# Patient Record
Sex: Female | Born: 1975 | Race: White | Marital: Married | Smoking: Never smoker
Health system: Northeastern US, Academic
[De-identification: ages and names within clinical notes are randomized; demographics above are authoritative.]

## PROBLEM LIST (undated history)

## (undated) DIAGNOSIS — IMO0002 Reserved for concepts with insufficient information to code with codable children: Secondary | ICD-10-CM

## (undated) DIAGNOSIS — D6851 Activated protein C resistance: Secondary | ICD-10-CM

## (undated) HISTORY — DX: Reserved for concepts with insufficient information to code with codable children: IMO0002

---

## 2009-08-12 ENCOUNTER — Ambulatory Visit: Payer: Self-pay

## 2009-08-12 ENCOUNTER — Other Ambulatory Visit: Payer: Self-pay | Admitting: Maternal & Fetal Medicine

## 2009-09-04 ENCOUNTER — Other Ambulatory Visit: Payer: Self-pay | Admitting: Gastroenterology

## 2009-09-04 ENCOUNTER — Ambulatory Visit: Payer: Self-pay

## 2009-09-04 ENCOUNTER — Ambulatory Visit
Admit: 2009-09-04 | Discharge: 2009-09-04 | Disposition: A | Discharge status: Home / Self Care | Payer: Self-pay | Source: Ambulatory Visit | Attending: Obstetrics and Gynecology | Admitting: Obstetrics and Gynecology

## 2009-09-09 LAB — MATERNAL 1ST TRIMESTER SCR (11-13 6/7 WEEKS)
Age at Delivery: 33 yrs.
CRL: 68.6 mm
DS A Priori Risk: 1:343 {titer}
DS Screen Risk: 1:989 {titer}
HCG MoM: 0.64
NT MoM: 1.27
NT: 2.1 mm
PAPP-A MoM: 0.28
PAPP-A: 0.79 m[IU]/mL
Patient Weight: 155 [lb_av]
T18 A Priori Risk: 1:855 {titer}
T18 Screen Risk: 1:207 {titer}
hCG: 45494 m[IU]/mL

## 2009-09-16 ENCOUNTER — Ambulatory Visit
Admit: 2009-09-16 | Discharge: 2009-09-16 | Disposition: A | Discharge status: Home / Self Care | Payer: Self-pay | Source: Ambulatory Visit | Attending: Obstetrics and Gynecology | Admitting: Obstetrics and Gynecology

## 2009-09-16 LAB — TYPE AND SCREEN FOR PNP
ABO RH Blood Type: O POS
Antibody Screen: NEGATIVE

## 2009-09-17 LAB — N. GONORRHOEAE DNA AMPLIFICATION: N. gonorrhoeae DNA Amplification: 0

## 2009-09-17 LAB — CHLAMYDIA PLASMID DNA AMPLIFICATION: Chlamydia Plasmid DNA Amplification: 0

## 2009-09-17 LAB — HIV-1 AND 2 AB: HIV 1&2 Ab screen: NEGATIVE

## 2009-09-17 LAB — AEROBIC CULTURE: Aerobic Culture: 0

## 2009-09-17 LAB — LEAD VENOUS: Lead,Venous: <1 ug/dL (ref 0–25)

## 2009-09-17 LAB — GYN CYTOLOGY

## 2009-09-17 LAB — HEPATITIS B SURFACE ANTIGEN: HBV S Ag: NEGATIVE

## 2009-09-18 LAB — OBSTETRICS PANEL
Baso # K/uL: 0 THOU/uL (ref 0.0–0.1)
Basophil %: 0.1 % (ref 0.1–1.2)
Eos # K/uL: 0.1 THOU/uL (ref 0.0–0.4)
Eosinophil %: 0.8 % (ref 0.7–5.8)
Hematocrit: 37 % (ref 34–45)
Hemoglobin: 12.4 g/dL (ref 11.2–15.7)
Lymph # K/uL: 2.1 THOU/uL (ref 1.2–3.7)
Lymphocyte %: 29.4 % (ref 19.3–51.7)
MCV: 91 fL (ref 79–95)
Mono # K/uL: 0.5 THOU/uL (ref 0.2–0.9)
Monocyte %: 6.3 % (ref 4.7–12.5)
Neut # K/uL: 4.6 THOU/uL (ref 1.6–6.1)
Platelets: 280 THOU/uL (ref 160–370)
RBC: 4.1 MIL/uL (ref 3.9–5.2)
RDW: 13.4 % (ref 11.7–14.4)
RPR Screen: NONREACTIVE
Rubella IgG AB: IMMUNE
Seg Neut %: 63.4 % (ref 34.0–71.1)
WBC: 7.2 THOU/uL (ref 4.0–10.0)

## 2009-09-30 LAB — CYSTIC FIBROSIS DNA (BAYLOR)

## 2009-10-20 ENCOUNTER — Other Ambulatory Visit: Payer: Self-pay | Admitting: Obstetrics and Gynecology

## 2009-10-20 ENCOUNTER — Ambulatory Visit
Admit: 2009-10-20 | Discharge: 2009-10-20 | Disposition: A | Discharge status: Home / Self Care | Payer: Self-pay | Source: Ambulatory Visit | Attending: Obstetrics and Gynecology | Admitting: Obstetrics and Gynecology

## 2009-10-20 ENCOUNTER — Ambulatory Visit: Payer: Self-pay

## 2009-10-21 LAB — MATERNAL AFP ONLY (14-22 67 WEEKS)
AFP MoM: 1.37
AFP: 59 IU/mL
Age at Delivery: 33 yrs.
OSB Risk: 1:6920 {titer}
Patient Weight: 167 [lb_av]

## 2009-11-06 ENCOUNTER — Ambulatory Visit: Payer: Self-pay

## 2009-11-06 ENCOUNTER — Other Ambulatory Visit: Payer: Self-pay | Admitting: Obstetrics and Gynecology

## 2009-12-14 ENCOUNTER — Ambulatory Visit
Admit: 2009-12-14 | Discharge: 2009-12-14 | Disposition: A | Discharge status: Home / Self Care | Payer: Self-pay | Source: Ambulatory Visit | Attending: Obstetrics and Gynecology | Admitting: Obstetrics and Gynecology

## 2009-12-14 LAB — GLUCOSE TOLERANCE, 1 HOUR: Glucose,50gm 1HR: 71 mg/dL (ref 63–135)

## 2009-12-14 LAB — HEMATOCRIT: Hematocrit: 37 % (ref 34–45)

## 2009-12-21 ENCOUNTER — Ambulatory Visit
Admit: 2009-12-21 | Discharge: 2009-12-21 | Disposition: A | Discharge status: Home / Self Care | Payer: Self-pay | Source: Ambulatory Visit | Attending: Obstetrics and Gynecology | Admitting: Obstetrics and Gynecology

## 2009-12-22 LAB — VAGINITIS SCREEN: DNA PROBE: Vaginitis Screen:DNA Probe: POSITIVE

## 2009-12-26 ENCOUNTER — Encounter: Payer: Self-pay | Admitting: Obstetrics and Gynecology

## 2009-12-26 ENCOUNTER — Ambulatory Visit: Admit: 2009-12-26 | Payer: Self-pay | Source: Ambulatory Visit | Admitting: Obstetrics and Gynecology

## 2010-02-17 ENCOUNTER — Ambulatory Visit
Admit: 2010-02-17 | Discharge: 2010-02-17 | Disposition: A | Discharge status: Home / Self Care | Payer: Self-pay | Source: Ambulatory Visit | Attending: Pediatrics | Admitting: Pediatrics

## 2010-02-19 LAB — GROUP B STREP CULTURE

## 2010-02-21 NOTE — L&D Delivery Note (Signed)
Delivery Summary Note  Patient: Melanie Curtis  Age: 35 y.o.  Date of Birth: 1975-08-30  ZOX:WRUEAV.  MRN: [id number]    Admission Summary  Date and time of admission: 03/06/2010  5:50 PM Attending Provider: Loleta Dicker, MD Group:  Active Hospital Problems   Diagnoses   . Marland Kitchen*Active labor   . Factor 5 Leiden mutation, heterozygous     Sulfa drugs  Pre-pregnancy weight:  Current Weight:   Weight gain:     Obstetric History    G4   P3   T3   P0   A0   TAB0   SAB0   E0   M0   L3       Feeding Type:     Circumcision:     Pediatrician:     Prenatal Labs  Rubella IgG AB   Date Value Range Status   09/16/2009 IMMUNE  IMMUNE (no units) Final      TEST METHOD: EIA      Gestational Age Information  LMP: Patient's last menstrual period was 06/10/2009.   EDC: 03/17/2010, by Last Menstrual Period  Gestational Age at Delivery:   Information for the patient's newborn:  Spenser, Boy [[id number]]   Gestational Age: <None>       Labor Summary  Labor Events:    Preterm labor: No   Rupture date:    Rupture time:    Rupture type: Spontaneous   Fluid Color: Clear   Induction: None   Augmentation: None   Complications:    Cervical ripening:         Stage 1:  hr minutes   Stage 2:  hr minutes   Stage 3:  hr minutes       Delivery:    Episiotomy: None   Lacerations: 1st   Repair Done: No    Sponge Count: Correct   Needle Count: Correct  Action-Taken:    Blood loss (ml): 250     Information for the patient's newborn:  Dejonae, Stuedemann [[id number]]     Delivery  Patient: Boy Ahlers  YQM:VHQI GA: Gestational Age: <None> MRN: [id number]  03/07/2010 5:11 AM by  Vaginal, Spontaneous Delivery    Delivery Clinician:  Loleta Dicker  Living?: Yes  Anesthesia: Epidural         APGARS  One minute Five minutes Ten minutes   Skin color:         Heart rate:         Grimace:         Muscle tone:         Breathing:         Totals:         Presentation/position: Vertex  Right Occiput Anterior  Resuscitation: None   Cord information: 3 Vessels   Disposition of cord blood:  Lab    Blood gases sent? No  Complications: None   Placenta: Delivered:    Spontaneous  Intact appearance  Newborn Measurements:  Weight:   Height:   Head circumference:   Chest circumference:    Other providers: Delivery Assist  Delivery Nurse Edd Arbour  Yetta Flock   Additional  information:  Forceps:    Vacuum:    Breech:    Observed anomalies            35yo caucasian female G4P3003 admitted to L&D with spontaneous labor after SROM at home.   Epidural for pain control. Progressed to full dilation and pushed for approximately 10 minutes to delivery  a female infant over intact perineum.   NSVD. No nuchal. Clear fluid. ROA presentation. Uncomplicated placenta delivery - intact, 3VC. Stable lochia.     Edd Arbour, MD   OBGYN Resident   Pager# [phone]

## 2010-03-06 ENCOUNTER — Inpatient Hospital Stay
Admit: 2010-03-06 | Disposition: A | Discharge status: Home / Self Care | Payer: Self-pay | Source: Ambulatory Visit | Attending: Pediatrics | Admitting: Pediatrics

## 2010-03-06 ENCOUNTER — Encounter: Payer: Self-pay | Admitting: Pediatrics

## 2010-03-06 DIAGNOSIS — IMO0001 Reserved for inherently not codable concepts without codable children: Secondary | ICD-10-CM | POA: Diagnosis present

## 2010-03-06 DIAGNOSIS — D6851 Activated protein C resistance: Secondary | ICD-10-CM | POA: Diagnosis present

## 2010-03-06 HISTORY — DX: Activated protein C resistance: D68.51

## 2010-03-06 LAB — CBC
Hematocrit: 37 % (ref 34–45)
Hemoglobin: 12.6 g/dL (ref 11.2–15.7)
MCV: 92 fL (ref 79–95)
Platelets: 224 10*3/uL (ref 160–370)
RBC: 4 MIL/uL (ref 3.9–5.2)
RDW: 13.4 % (ref 11.7–14.4)
WBC: 12.6 10*3/uL — ABNORMAL HIGH (ref 4.0–10.0)

## 2010-03-06 LAB — TYPE AND SCREEN
ABO RH Blood Type: O POS
Antibody Screen: NEGATIVE

## 2010-03-06 MED ORDER — ONDANSETRON HCL 2 MG/ML IV SOLN *I*
4.0000 mg | Freq: Four times a day (QID) | INTRAMUSCULAR | Status: DC | PRN
Start: 2010-03-06 — End: 2010-03-08
  Administered 2010-03-07: 4 mg via INTRAVENOUS
  Filled 2010-03-06: qty 2

## 2010-03-06 MED ORDER — ONDANSETRON HCL 2 MG/ML IV SOLN *I*
INTRAMUSCULAR | Status: AC
Start: 2010-03-06 — End: 2010-03-06
  Administered 2010-03-06: 4 mg via INTRAVENOUS
  Filled 2010-03-06: qty 2

## 2010-03-06 MED ORDER — LACTATED RINGERS IV SOLN *I*
150.0000 mL/h | INTRAVENOUS | Status: DC
Start: 2010-03-06 — End: 2010-03-07
  Administered 2010-03-06 – 2010-03-07 (×5): 150 mL/h via INTRAVENOUS

## 2010-03-06 MED ORDER — PENICILLIN G POTASSIUM 5 MU IN NS 50 ML IVPB *I*
INTRAVENOUS | Status: AC
Start: 2010-03-06 — End: 2010-03-06
  Administered 2010-03-06: 5 10*6.[IU] via INTRAVENOUS
  Filled 2010-03-06: qty 50

## 2010-03-06 MED ORDER — PENICILLIN G POTASSIUM 5 MU IN NS 50 ML IVPB *I*
5.0000 10*6.[IU] | Freq: Once | INTRAVENOUS | Status: AC
Start: 2010-03-06 — End: 2010-03-06

## 2010-03-06 MED ORDER — PENICILLIN G POT IN DEXTROSE 60000 UNIT/ML IV SOLN *I*
3.0000 10*6.[IU] | INTRAVENOUS | Status: DC
Start: 2010-03-06 — End: 2010-03-07
  Administered 2010-03-06 – 2010-03-07 (×2): 3 10*6.[IU] via INTRAVENOUS
  Filled 2010-03-06 (×2): qty 50

## 2010-03-06 MED ORDER — PROMETHAZINE HCL 25 MG/ML IJ SOLN *I*
12.5000 mg | Freq: Once | INTRAMUSCULAR | Status: AC
Start: 2010-03-06 — End: 2010-03-06

## 2010-03-06 MED ORDER — PROMETHAZINE HCL 25 MG/ML IJ SOLN *I*
INTRAMUSCULAR | Status: AC
Start: 2010-03-06 — End: 2010-03-06
  Administered 2010-03-06: 12.5 mg via INTRAVENOUS
  Filled 2010-03-06: qty 1

## 2010-03-06 NOTE — Progress Notes (Signed)
Pt moved to labor room at 1816. Pt. And husband refusing to wear toco for 20 minute strip. Pt and husband willing to wear ultrasound for 20 minute strip if husband holds monitor. Ok as per [employer] Tol. 20 minute strip complete. Unable to determine variables, and length of contractions due to no toco. Pt and husband report length of contraction and strength. As per pts husband, "baby moving a lot causing heart rate to be high."  As per Dr Karma Greaser, pt to come off EFM and pt to be intermittently monitored.  Unable to complete Admit. Info, pt and husband preferring to have birth plan to answer questions or to be addressed later.

## 2010-03-06 NOTE — Interdisciplinary Rounds (Signed)
35yo [id number] at [redacted]w[redacted]d admitted in spontaneous labor after SROM at home at 4pm for blood-tinged fluid.     Rh+ / Rub Immune / GBS + -> PCN running.   3cm/50%/ posterior on admission.     Risks: GBS+, Factor 5 leiden heterozygote.     *FYI: Husband is an ED resident @ [facility].    Desires minimal intervention and intermittent monitoring.     FHR tracing segments reviewed with team.     Intercare Team: C Previte (24hr attd), E Fountaine, L Berven, SCN, Science writer (CRNA), Charge RN     Edd Arbour, MD   OBGYN Resident   Pager# [phone]

## 2010-03-06 NOTE — Progress Notes (Signed)
INTRAPARTUM NOTE:    S: Pt in the tub per nursing, no complaints.    O:  Filed Vitals:    03/06/10 1857 03/06/10 1900   BP: 102/65    Pulse: 119    Temp:  36.6 C (97.9 F)   TempSrc:  Axillary     Pelvic Exam:  Deferred.  Per nursing, now 4-5 cm, still high.    EFM: Intermittent monitoring  Baseline  125 bpm         Variability moderate       Accels: present       Decels: absent  Cat I  Toco: not clearly recording, pt now in tub.  Labor Assessment: entering active labor.    A/P:    35 y.o. G4P3003 at [redacted]w[redacted]d admitted for SROM, labor.    1) GBS +, on PCN, last dose 2210  2) Pain: Pt does not want pain meds  3) Nausea: phenergan and zofran given  4) EFM intermittent per pt request  5) Labor: Expectant management.    Duane Lope, MD  03/06/2010  11:14 PM

## 2010-03-06 NOTE — Progress Notes (Signed)
Back in bed. FHR 120 - 130 on intermittent monitoring. Contractions every 2-3 minutes. Relaxing well between contractions. Does not want pain medication.     Blood pressure 102/65, pulse 119, temperature 36.6 C (97.9 F), temperature source Axillary, last menstrual period 06/10/2009.    A/p:  Active labor - 2nd dose of PCN given. Continue intermittent monitoring.

## 2010-03-06 NOTE — H&P (Signed)
OB H&P for Inpatients  History of Present Illness:  Melanie Curtis is a 35 y.o. G4P3003 at [redacted]w[redacted]d weeks and estimated date of delivery of 03/17/2010, by Last Menstrual Period who presents with No chief complaint on file.   She has Active labor and Factor 5 Leiden mutation, heterozygous on her problem list.     OB History     Grav Para Term Preterm Abortions TAB SAB Ect Mult Living    4 3 3  0 0 0 0 0 0 3       # Outc Date GA Lbr Len/2nd Wgt Sex Del Anes PTL Lv    1 CUR             2 TRM  100w2d  6.045WU([id number]) M SVD       3 TRM  [redacted]w[redacted]d  7.829FA([id number]) F SVD       Comments: Vaginal breech    4 TRM  [redacted]w[redacted]d  4.536kg(10lb) M SVD              Melanie Curtis  has a past medical history of Factor 5 Leiden mutation, heterozygous and Cervical dysplasia. She  has past surgical history that includes Tonsillectomy and adenoidectomy. Santia is allergic to sulfa drugs. Her family history is negative except for distant relative with Factor V Leiden.  family history is not on file.She  reports that she has never smoked. She has never used smokeless tobacco. She reports that she does not drink alcohol or use illicit drugs..    Prior to Admission Medications:  Prescriptions prior to admission   Medication Sig   . acetaminophen-codeine (TYLENOL #3) 300-30 MG per tablet Take 1-2 tablets by mouth every 12 hours as needed.         Review of Systems:  Review of Systems   Constitutional: Negative.    HENT: Negative.    Eyes: Negative.    Respiratory: Negative.    Cardiovascular: Negative.    Gastrointestinal: Positive for nausea. Negative for heartburn, vomiting, abdominal pain, diarrhea, constipation, blood in stool and melena.   Genitourinary: Negative.    Musculoskeletal: Negative.    Skin: Negative.    Neurological: Negative.    Psychiatric/Behavioral: Negative.        Last Nursing documented pain:      Vitals: BP 102/65  Pulse 119  Temp(Src) 36.6 C (97.9 F) (Axillary)  LMP 06/10/2009     Physical Examination:    HEENT:  Normocephalic; atraumatic  Cardiovascular: Regular rate and rhythm with no murmurs  Respiratory: Clear to auscultate  Abdomen: Gravid non-tender    Fundal Height:41 cm Estimated Fetal Weight: 8 1/2 # Estimated by: Leopold's     Pelvis Adequacy: Adequate    Neurological: Normal, average response (2+)  Extremities/Skin: Minimal edema of pedal and pretibial (1+)    Lab Results:   ABO RH Blood Type   Date Value Range Status   03/06/2010 O RH POS   Final        Antibody Screen   Date Value Range Status   03/06/2010 Negative   Final        Rubella IgG AB   Date Value Range Status   09/16/2009 IMMUNE  IMMUNE (no units) Final      TEST METHOD: EIA        RPR Screen   Date Value Range Status   09/16/2009 NONREACT  NONREACT (no units) Final      TEST METHOD: Charcoal Particle Agglutination  HBV S Ag   Date Value Range Status   09/16/2009 NEG   Final        Group B Strep Culture   Date Value Range Status   02/17/2010 Streptococcus agalactiae (Group B)   Final        HIV 1 and 2 Ab   Date Value Range Status   09/16/2009 NEG   Final      TEST METHOD: EIA        Glucose,50gm 1HR   Date Value Range Status   12/14/2009 71  63-135 (mg/dL) Final        Chlamydia Plasmid DNA Amplification   Date Value Range Status   09/16/2009 .   Final        Hematocrit   Date Value Range Status   03/06/2010 37  34-45 (%) Final        Platelets   Date Value Range Status   03/06/2010 224  160-370 (THOU/uL) Final         Last Pap: normal        Ultrasound    Date: 03/06/10 in triage Gestational Age: no measurements done Estimated Fetal weight: 8 1/2 #, Fetal Presentation: vertex  Impression: vertex    Assessment & Plan  G4P3003, term pregnancy with SROM, GBS+ - admit, start PCN. Pt wishes no pain medication. Discussed patient's wish for intermittent monitoring, willing to have continuous monitoring if needed.      Author: Loleta Dicker, MD  Note created: 03/06/2010  at: 8:40 PM

## 2010-03-06 NOTE — Progress Notes (Signed)
34 y.o. G4 P4004 admitted after SROM at home, approximately 4:15 PM. Had large gush of blood tinged fluid. Feeling very active fetal movement. Started having contractions 1 hour prior to admission. GBS+. Past 2 weeks has been having sciatic nerve pain and right sided rib pain, taking Tylenol #3, 2-4 tabs per day. Pregnancy otherwise uncomplicated. [place] in triage confirmed vertex presentation.     Here with husband, [name]. Pt strongly prefers intermittent monitoring, no pain medications and infrequent cervical exams.     FHR initially 160 baseline with accelerations. Then increased to baseline of 180 with accelerations. Pt is afebrile but likely some dehydration as did not drink a lot today, feeling nauseated. IVF bolus given, baseline decreased to 160.  Doing intermittent hand-held monitoring per patient's wishes.     PE:  Cervix - 3 cm, 50%, posterior, -2 station  Fluid continues to be blood tinged.     A/P:  Term, SROM, GBS+, initial fetal tachycardia improved after IVF bolus, early labor.  Continue intermittent monitoring per patient wishes, 2nd liter of IVF, Zofran 4 mg IV for nausea, PCN IV.  Pt does not want want pain medication.

## 2010-03-07 ENCOUNTER — Encounter: Payer: Self-pay | Admitting: Pediatrics

## 2010-03-07 MED ORDER — IBUPROFEN 200 MG TAB - SELF MED *I*
600.0000 mg | ORAL_TABLET | Freq: Four times a day (QID) | ORAL | Status: DC | PRN
Start: 2010-03-07 — End: 2010-03-08

## 2010-03-07 MED ORDER — DIPHENHYDRAMINE HCL 25 MG PO TABS *I*
25.0000 mg | ORAL_TABLET | Freq: Every evening | ORAL | Status: DC | PRN
Start: 2010-03-07 — End: 2010-03-08

## 2010-03-07 MED ORDER — LACTATED RINGERS IV SOLN *I*
150.0000 mL/h | INTRAVENOUS | Status: DC
Start: 2010-03-07 — End: 2010-03-08

## 2010-03-07 MED ORDER — FENTANYL 2 MCG/ML AND 0.125% BUPIVACAINE *A*
12.0000 mL/h | INTRAMUSCULAR | Status: DC
Start: 2010-03-07 — End: 2010-03-07

## 2010-03-07 MED ORDER — OXYCODONE-ACETAMINOPHEN 5-325 MG PO TABS *I*
1.0000 | ORAL_TABLET | ORAL | Status: DC | PRN
Start: 2010-03-07 — End: 2010-03-08
  Administered 2010-03-07 – 2010-03-08 (×6): 1 via ORAL
  Filled 2010-03-07 (×6): qty 1

## 2010-03-07 MED ORDER — ONDANSETRON HCL 2 MG/ML IV SOLN *I*
4.0000 mg | Freq: Four times a day (QID) | INTRAMUSCULAR | Status: DC | PRN
Start: 2010-03-07 — End: 2010-03-08

## 2010-03-07 MED ORDER — DOCUSATE SODIUM 100 MG SELF MED *A*
100.0000 mg | ORAL_CAPSULE | Freq: Two times a day (BID) | ORAL | Status: DC | PRN
Start: 2010-03-07 — End: 2010-03-08

## 2010-03-07 NOTE — Anesthesia Pre-procedure Eval (Signed)
Anesthesia Pre-operative Evaluation for Melanie Curtis  Health History  Past Medical History   Diagnosis Date   . Factor 5 Leiden mutation, heterozygous    . Cervical dysplasia      Past Surgical History   Procedure Date   . Tonsillectomy and adenoidectomy      Social History  History   Substance Use Topics   . Smoking status: Never Smoker    . Smokeless tobacco: Never Used   . Alcohol Use: No      History   Drug Use No     Allergies:   Allergies   Allergen Reactions   . Sulfa Drugs Rash     Medications   Prescriptions prior to admission   Medication Sig   . acetaminophen-codeine (TYLENOL #3) 300-30 MG per tablet Take 1-2 tablets by mouth every 12 hours as needed.        Current Facility-Administered Medications   Medication Dose Route Frequency   . penicillin G potassium IV 5 Million Units  5 Million Units Intravenous Once   . penicillin G potassium IVPB 3 Million Units  3 Million Units Intravenous Q4H   . ondansetron (ZOFRAN) injection 4 mg  4 mg Intravenous Q6H PRN   . Lactated Ringers Infusion  150 mL/hr Intravenous Continuous   . promethazine (PHENERGAN) injection 12.5 mg  12.5 mg Intravenous Once     Medications Administered by Facility in Past 24hrs  Lactated Ringers Infusion     Date Action Dose Route User    03/07/2010 0042 New Bag 150 mL/hr Intravenous Yetta Flock, RN    03/06/2010 2210 New Bag 150 mL/hr Intravenous Yetta Flock, RN    03/06/2010 1938 New Bag 150 mL/hr Intravenous Yetta Flock, RN    03/06/2010 1855 New Bag 150 mL/hr Intravenous Lavone Nian, RN      ondansetron [facility]) injection 4 mg     Date Action Dose Route User    03/06/2010 1942 Given 4 mg Intravenous Yetta Flock, RN      penicillin G potassium IVPB 3 Million Units     Date Action Dose Route User    03/06/2010 2210 Given 3 Million Units Intravenous Yetta Flock, RN      penicillin G potassium IV 5 Million Units     Date Action Dose Route User    03/06/2010 1849 Given 5 Million Units Intravenous Lavone Nian, RN      promethazine (PHENERGAN) injection 12.5 mg     Date Action Dose Route User    03/06/2010 2125 Given 12.5 mg Intravenous Yetta Flock, RN           Anesthesia Evaluation      No history of anesthetic complications   Airway   Mallampati: II  TM distance: >3 FB  Neck ROM: full  Dental      Pulmonary - negative ROS   Cardiovascular - negative ROS    Neuro/Psych - negative ROS     GI/Hepatic/Renal - negative ROS     Endo/Other - negative ROS   Abdominal                         Additional ROS/Co-morbidities: None known    Mental Status: alert, oriented to person, place, and time    Last PO Intake: Cereal this morning, sips of water all day     Most Recent Vitals: BP 102/65  Pulse 119  Temp(Src) 36.6  C (97.9 F) (Axillary)  LMP 06/10/2009  Vital Sign Ranges (last 24hrs)  Temp:  [36.6 C (97.9 F)] 36.6 C (97.9 F)  Heart Rate:  [119] 119   BP: (102)/(65) 102/65 mmHg        Most Recent Lab Results   Blood Type  Lab Results   Component Value Date    ABORH O RH POS 03/06/2010    ABS Negative 03/06/2010        CBC  Lab Results   Component Value Date    WBC 12.6* 03/06/2010    HCT 37 03/06/2010    PLT 224 03/06/2010    Chem-7  No results found for this basename: NA, K, WBK, CL, CO2, UN, CREAT, WBGLU, PGLU     Electrolytes  No results found for this basename: CA, MG, PO4    Coags  No results found for this basename: PTI, INR, PTT    LFTs  No results found for this basename: AST, ALT, ALK         Pregnancy Test (if applicable)  No results found for this basename: PUPT, UPREG, SPREG, HCG1       EKG Results    All labs in the last 72 hours   Recent Results (from the past 72 hour(s))   CBC    Collection Time    03/06/10  6:54 PM       Component Value Range    WBC 12.6 (*) 4.0 - 10.0 (THOU/uL)    RBC 4.0  3.9 - 5.2 (MIL/uL)    Hemoglobin 12.6  11.2 - 15.7 (g/dL)    Hematocrit 37  34 - 45 (%)    MCV 92  79 - 95 (fL)    RDW 13.4  11.7 - 14.4 (%)    Platelets 224  160 - 370 (THOU/uL)   TYPE AND SCREEN    Collection Time     03/06/10  6:54 PM       Component Value Range    ABO RH Blood Type O RH POS      Antibody Screen Negative           Medical Problems  Patient Active Problem List   Diagnoses Date Noted   . Active labor 03/06/2010   . Factor 5 Leiden mutation, heterozygous 03/06/2010     PreOp/PreAn Diagnosis: Pregnancy, Labor Pain    Planned Procedure: Labor Epidural    Anesthesia Plan    ASA 1     regional   (Labor Epidural, Patient Understands And Accepts Risks Of Anesthesia)  intravenous induction   Anesthetic plan and risks discussed with patient.    Plan discussed with CRNA.        Anesthesia Risks discussed: allergic reaction, unexpected serious injury, death and spinal headache, hypotension, infection, temporary or permanent lower extremity weakness or numbness     Invasive Monitoring discussed:  none    Attending Attestation: The patient or proxy understand and accept the risks and benefits of the anesthesia plan. By accepting this note, I attest that I have personally performed the history and physical exam and prescribed the anesthetic plan within 48 hours prior to the anesthetic as documented by me above.    Author: Modena Nunnery, MD Note created: 03/07/2010  at: 12:50 AM

## 2010-03-07 NOTE — Progress Notes (Signed)
Peri care and fresh pad applied in bed, pt feels urge to void, pt oob to bathroom with minimal assist, pt transferred to [id number] in wheel chair with baby by Pyatt RN, report given to [employer]

## 2010-03-07 NOTE — Anesthesia Post-procedure Eval (Signed)
Anesthesia Post-op Note    Patient: Melanie Curtis    Procedure(s) Performed: Labor Epidural    Anesthesia type: Epidural    Patient location: Labor and Delivery    Mental Status: Recovered to baseline    Patient able to participate in this evaluation: yes  Last Vitals: BP 105/51  Pulse 105  Temp(Src) 36.8 C (98.2 F) (Temporal)  Resp 17  Ht 1.765 m (5' 9.5")  Wt 96.163 kg (212 lb)  BMI 30.86 kg/m2  SpO2 95%  LMP 06/10/2009  Breastfeeding? Unknown     Post-op vital signs noted above are within patient's normal range  Post-op vitals signs: stable  Respiratory function: baseline    Airway patent: Yes    Cardiovascular and hydration status stable: Yes    Post-Op pain: Adequate analgesia    Post-Op assessment: no apparent anesthetic complications    Complications: none    Attending Attestation: All indicated post anesthesia care provided    Author: Modena Nunnery, MD  as of: 03/07/2010  at: 8:20 AM

## 2010-03-07 NOTE — Progress Notes (Signed)
Much more comfortable after second epidural, BP low but now increasing after IVF.      Cervix 8 cm prior to epidural, 0 station, 90%.  FHR - no decels, moderate variability, accels present, Cat I    A/P:  35 y.o. G4P3003 at [redacted]w[redacted]d, active labor.   -3rd dose of PCN for GBS  - second dose of Zofran for nausea  - continue current management.

## 2010-03-07 NOTE — Discharge Summary (Signed)
Discharge Summary       Admit date: 03/06/2010         Discharge date and time: 03/09/2010  Admitting Physician: Loleta Dicker, MD   Discharge Attending: Dr. Bluford Main     Patient: Melanie Curtis Age: 35 y.o. Date of Birth: May 14, 1975 ZOX:WRUEAV    Chief Complaint: Active Labor, SROM  Principal Problem: s/p Normal Spontaneous Vaginal Delivery     Details of Admission: as per admission H&P    Discharge Diagnoses:  Active Hospital Problems   Diagnoses   . Active labor   . Factor 5 Leiden mutation, heterozygous      Resolved Hospital Problems   Diagnoses       Hospital Course (including key diagnostic test results):    35yo caucasian female G4P3003 at [redacted]w[redacted]d ega admitted in spontaneous labor after SROM at home. She was 3cm on initial exam. She received an epidural for anesthesia and progressed in labor to full dilation with NO augmentation.     She pushed for approximately 10 minutes to delivery a viable female infant over intact perineum via a spontaneous vaginal delivery. Minimal first degree perineal laceration, hemostatic and therefore not repaired. Please see delivery summary for full details of delivery. Patient and infant tolerated delivery well.     Melanie Curtis was meeting all necessary post partum milestones and is being discharged to home in stable condition. She will follow up in the office for a post-partum visit.     Key Exam Findings at Discharge:    Vitals: Blood pressure 102/50, pulse 97, temperature 36.6 C (97.9 F), temperature source Axillary, last menstrual period 06/10/2009, SpO2 95.00%, unknown if currently breastfeeding.    Pending Test Results: None    Consulting Providers: none    Discharged Condition: good    Discharge medications, instructions, and follow-up plans: as per After Visit Summary    Disposition: Home with no services    Edd Arbour, MD   OBGYN Resident   Pager# [phone]     Signed: Edd Arbour, MD  On: 03/07/2010  at: 5:23 AM

## 2010-03-07 NOTE — Anesthesia Pre-procedure Eval (Signed)
Anesthesia Pre-operative Evaluation for Melanie Curtis  Health History  Past Medical History   Diagnosis Date   . Factor 5 Leiden mutation, heterozygous    . Cervical dysplasia      Past Surgical History   Procedure Date   . Tonsillectomy and adenoidectomy      Social History  History   Substance Use Topics   . Smoking status: Never Smoker    . Smokeless tobacco: Never Used   . Alcohol Use: No      History   Drug Use No     Allergies:   Allergies   Allergen Reactions   . Sulfa Drugs Rash     Medications   Prescriptions prior to admission   Medication Sig   . acetaminophen-codeine (TYLENOL #3) 300-30 MG per tablet Take 1-2 tablets by mouth every 12 hours as needed.        Current Facility-Administered Medications   Medication Dose Route Frequency   . Fentanyl-Bupivacaine 68mcg/ml-0.125% Drip  12 mL/hr Epidural Continuous   . penicillin G potassium IV 5 Million Units  5 Million Units Intravenous Once   . penicillin G potassium IVPB 3 Million Units  3 Million Units Intravenous Q4H   . ondansetron (ZOFRAN) injection 4 mg  4 mg Intravenous Q6H PRN   . Lactated Ringers Infusion  150 mL/hr Intravenous Continuous   . promethazine (PHENERGAN) injection 12.5 mg  12.5 mg Intravenous Once     Medications Administered by Facility in Past 24hrs  Lactated Ringers Infusion     Date Action Dose Route User    03/07/2010 0042 New Bag 150 mL/hr Intravenous Yetta Flock, RN    03/06/2010 2210 New Bag 150 mL/hr Intravenous Yetta Flock, RN    03/06/2010 1938 New Bag 150 mL/hr Intravenous Yetta Flock, RN    03/06/2010 1855 New Bag 150 mL/hr Intravenous Lavone Nian, RN      ondansetron [facility]) injection 4 mg     Date Action Dose Route User    03/06/2010 1942 Given 4 mg Intravenous Yetta Flock, RN      penicillin G potassium IVPB 3 Million Units     Date Action Dose Route User    03/06/2010 2210 Given 3 Million Units Intravenous Yetta Flock, RN      penicillin G potassium IV 5 Million Units     Date Action Dose  Route User    03/06/2010 1849 Given 5 Million Units Intravenous Lavone Nian, RN      promethazine (PHENERGAN) injection 12.5 mg     Date Action Dose Route User    03/06/2010 2125 Given 12.5 mg Intravenous Yetta Flock, RN           Anesthesia Evaluation      No history of anesthetic complications   Airway   Mallampati: II  TM distance: >3 FB  Neck ROM: full  Dental      Pulmonary - negative ROS   Cardiovascular - negative ROS    Neuro/Psych - negative ROS     GI/Hepatic/Renal - negative ROS     Endo/Other - negative ROS   Abdominal                           Additional ROS/Co-morbidities: None known    Mental Status: alert, oriented to person, place, and time    Last PO Intake: Cereal this morning, sips of water all day  Most Recent Vitals: BP 102/65  Pulse 119  Temp(Src) 36.6 C (97.9 F) (Axillary)  LMP 06/10/2009  Vital Sign Ranges (last 24hrs)  Temp:  [36.6 C (97.9 F)] 36.6 C (97.9 F)  Heart Rate:  [119] 119   BP: (102)/(65) 102/65 mmHg        Most Recent Lab Results   Blood Type  Lab Results   Component Value Date    ABORH O RH POS 03/06/2010    ABS Negative 03/06/2010        CBC  Lab Results   Component Value Date    WBC 12.6* 03/06/2010    HCT 37 03/06/2010    PLT 224 03/06/2010    Chem-7  No results found for this basename: NA,  K,  WBK,  CL,  CO2,  UN,  CREAT,  WBGLU,  PGLU     Electrolytes  No results found for this basename: CA,  MG,  PO4    Coags  No results found for this basename: PTI,  INR,  PTT    LFTs  No results found for this basename: AST,  ALT,  ALK         Pregnancy Test (if applicable)  No results found for this basename: PUPT,  UPREG,  SPREG,  HCG1       EKG Results    All labs in the last 72 hours   Recent Results (from the past 72 hour(s))   CBC    Collection Time    03/06/10  6:54 PM       Component Value Range    WBC 12.6 (*) 4.0 - 10.0 (THOU/uL)    RBC 4.0  3.9 - 5.2 (MIL/uL)    Hemoglobin 12.6  11.2 - 15.7 (g/dL)    Hematocrit 37  34 - 45 (%)    MCV 92  79 - 95 (fL)    RDW  13.4  11.7 - 14.4 (%)    Platelets 224  160 - 370 (THOU/uL)   TYPE AND SCREEN    Collection Time    03/06/10  6:54 PM       Component Value Range    ABO RH Blood Type O RH POS      Antibody Screen Negative           Medical Problems  Patient Active Problem List   Diagnoses Date Noted   . Active labor 03/06/2010   . Factor 5 Leiden mutation, heterozygous 03/06/2010     PreOp/PreAn Diagnosis: Pregnancy, Labor Pain    Planned Procedure: Labor Epidural    Anesthesia Plan    ASA 1     regional   (Labor Epidural, Patient Understands And Accepts Risks Of Anesthesia)  intravenous induction   Anesthetic plan and risks discussed with patient.    Plan discussed with attending.        Anesthesia Risks discussed: allergic reaction, unexpected serious injury, death and spinal headache, hypotension, infection, temporary or permanent lower extremity weakness or numbness     Invasive Monitoring discussed:  none    PEC/PreOp Attestation: Anesthesia options were discussed with the patient or proxy and they understand the risks and benefits of the various anesthetic options.    Author: Wayna Chalet, CRNA Note created: 03/07/2010  at: 2:15 AM

## 2010-03-07 NOTE — Progress Notes (Signed)
INTRAPARTUM NOTE:    S: Pt had one epidural that was ineffective, was replaced, now more comfortable    O:  Filed Vitals:    03/06/10 1857 03/06/10 1900   BP: 102/65    Pulse: 119    Temp:  36.6 C (97.9 F)   TempSrc:  Axillary     Pelvic Exam:  Deferred.  Per nursing, now 4-5 cm, still high.    EFM: Intermittent monitoring  Baseline  125 bpm         Variability moderate       Accels: present       Decels: absent  Cat I  Toco: q2-4 min  Labor Assessment: active labor    A/P:    35 y.o. G4P3003 at [redacted]w[redacted]d admitted for SROM, labor.    1) GBS +, on PCN,  Had doses at 1849 and 2210.  2) Pain: Pt received epidural.  3) Nausea: phenergan and zofran given  4) EFM reassuring.  5) Labor: Expectant management.    Duane Lope, MD  03/07/2010  2:35 AM

## 2010-03-08 LAB — RPR: RPR Screen: NONREACTIVE

## 2010-03-08 MED ORDER — OXYCODONE-ACETAMINOPHEN 5-325 MG PO TABS *I*
1.0000 | ORAL_TABLET | ORAL | Status: AC | PRN
Start: 2010-03-08 — End: 2010-03-18

## 2010-03-08 MED ORDER — IBUPROFEN 200 MG TAB - SELF MED *I*
600.0000 mg | ORAL_TABLET | Freq: Four times a day (QID) | ORAL | Status: AC | PRN
Start: 2010-03-08 — End: 2010-03-18

## 2010-03-08 NOTE — Progress Notes (Signed)
OB PROGRESS NOTES:     Postpartum day: 1    S:  Patient without complaints .  Pain is well controlled. Denies chest pain, shortness of breath, nausea, vomiting.  Tolerating regular diet.   Ambulating, voiding and breast feeding without any issues. No complaints of pain in breasts or in either calf.    Filed Vitals:    03/08/10 0410   BP: 94/58   Pulse: 80   Temp: 35.9 C (96.6 F)   Resp: 16       I/Os:      Intake/Output Summary (Last 24 hours) at 03/08/10 0521  Last data filed at 03/07/10 1259   Gross per 24 hour   Intake      0 ml   Output   1650 ml   Net  -1650 ml       Physical Exam:   HEENT: Normocephalic; atraumatic  Cardiovascular: Regular rate and rhythm with no murmurs  Respiratory: Clear to auscultate  Abdomen: Soft, none tender  Neurological: Normal, average response (2+)  Extremities/Skin: No edema noted  Fundus:  Fundus firm at umbilicus -3    A/P: 35 y.o. [id number] on PPD# 1 s/p NSVD. Doing well.    1) Continue routine postop care  2) Increase ambulation  3) Infant gender: female  4) Feeding type: breast  5) PPBC: as per Attending  6) Rh: positive  7) Dispo: Likely d/c home PPD#2    Sim Choquette A. Katha Cabal, MD  Maternal and Child Health/ Obstetrics Fellow

## 2010-03-08 NOTE — Progress Notes (Signed)
Postpartum Day: 1    Subjective: Tired but otherwise doing well. Baby nursing very frequently. Having significant cramping, taking Ibuprofen and Percocet prn. Lochia - WNL. Has good support at home.  Undecided about whether to go home today or tomorrow. Wants IUD for contraception.      Objective: Heart - RRR, no m/g/r  Lungs - CTA  Fundus - firm, U-1  Ext - no calf tenderness, no edema    Vitals:   Filed Vitals:    03/07/10 1622 03/07/10 2018 03/07/10 2315 03/08/10 0410   BP: 87/49 108/58 98/64 94/58    Pulse: 86 100 95 80   Temp: 36.4 C (97.5 F) 36.4 C (97.5 F) 36 C (96.8 F) 35.9 C (96.6 F)   TempSrc: Temporal Temporal Oral Oral   Resp: 17 18 18 16    Height:       Weight:       SpO2: 98% 98% 96% 97%       I/O last 3 completed shifts:  In: - (0 mL/kg)   Out: 1400 (14.6 mL/kg) [Urine:1400 (0.9 mL/kg/hr)]  Net: -1400  Weight used: 96.2 kg    Last Nursing documented pain:  0-10 Scale: 4 (03/08/10 0800)    Current Facility-Administered Medications   Medication Dose Route Frequency   . Lactated Ringers Infusion  150 mL/hr Intravenous Continuous   . docusate sodium (COLACE) capsule - SELF MED 100 mg  100 mg Oral BID PRN   . ibuprofen (ADVIL) tablet SELF MED 600 mg  600 mg Oral Q6H PRN   . oxycodone-acetaminophen (PERCOCET) 5-325 MG per tablet 1 tablet  1 tablet Oral Q3H PRN   . diphenhydrAMINE (BENADRYL) tablet 25 mg  25 mg Oral QHS PRN   . ondansetron (ZOFRAN) injection 4 mg  4 mg Intravenous Q6H PRN   . ondansetron (ZOFRAN) injection 4 mg  4 mg Intravenous Q6H PRN         Physical Exam:    Cardiovascular: Regular rate and rhythm with no murmurs  Abdomen: Soft, appropriately tender, nondistended, +BS and Not assessed  Breasts: soft      Currently Active/Followed Hospital Problems:  Active Hospital Problems   Diagnoses   . Marland Kitchen*SVD (spontaneous vaginal delivery)   . Factor 5 Leiden mutation, heterozygous       Assessment and Plan:  Doing well postpartum. Anticipate discharge home today or tomorrow. Pt will discuss  with husband. Plan IUD postpartum, f/u appt in 2-3 days after discharge.     Author: Loleta Dicker, MD  as of: 03/08/2010  at: 8:27 AM

## 2010-04-21 ENCOUNTER — Ambulatory Visit
Admit: 2010-04-21 | Discharge: 2010-04-21 | Disposition: A | Discharge status: Home / Self Care | Payer: Self-pay | Source: Ambulatory Visit | Admitting: Pediatrics

## 2010-04-27 LAB — GYN CYTOLOGY

## 2012-02-22 DIAGNOSIS — Z130 Encounter for screening for diseases of the blood and blood-forming organs and certain disorders involving the immune mechanism: Secondary | ICD-10-CM

## 2012-02-22 HISTORY — DX: Encounter for screening for diseases of the blood and blood-forming organs and certain disorders involving the immune mechanism: Z13.0

## 2012-04-09 ENCOUNTER — Ambulatory Visit
Admit: 2012-04-09 | Discharge: 2012-04-09 | Disposition: A | Discharge status: Home / Self Care | Payer: Self-pay | Source: Ambulatory Visit | Attending: Pediatrics | Admitting: Pediatrics

## 2012-04-09 ENCOUNTER — Other Ambulatory Visit: Payer: Self-pay | Admitting: Pediatrics

## 2012-04-09 DIAGNOSIS — M25569 Pain in unspecified knee: Secondary | ICD-10-CM

## 2012-04-25 ENCOUNTER — Ambulatory Visit: Payer: Self-pay | Admitting: Orthopedic Surgery

## 2012-04-25 ENCOUNTER — Encounter: Payer: Self-pay | Admitting: Orthopedic Surgery

## 2012-04-25 VITALS — BP 129/60 | Ht 69.5 in | Wt 177.8 lb

## 2012-04-25 DIAGNOSIS — S83289A Other tear of lateral meniscus, current injury, unspecified knee, initial encounter: Secondary | ICD-10-CM

## 2012-04-25 NOTE — Progress Notes (Signed)
CHIEF COMPLAINT: Left knee injury    HISTORY OF PRESENT ILLNESS: This patient is a 37 year old otherwise healthy, active female who presents today for the first time for evaluation of an injury to her left knee.  She states over 3 weeks ago she was at home walking down some stairs when she planted and turned to her left causing a "popping" sensation in her left knee.  She did not feel any mechanical shifting.  She describes a sharp, mechanical pain along the medial and lateral aspect of her knee ever since.  She has been on therapeutic doses of anti-inflammatories with minimal symptomatic improvement.    PAST MEDICAL HISTORY: Denies any significant past medical history    PAST SURGICAL HISTORY: None listed    MEDICATIONS:  See Medication List    ALLERGIES:  See Allergy List    SOCIAL HISTORY: She is married.  She does not smoke use drugs.  She does drink alcohol.    FAMILY HISTORY: Noncontributory    REVIEW OF SYSTEMS:  Review of Gastointestinal, Genitourinary, Neurologic, Integument, Vascular, Hematologic, Lymphatic, Cardiac, Pulmonary and Endocrine systems reveal the following: Negative for pertinent positives    PHYSICAL EXAM: Healthy-appearing female.  Her left knee shows a small effusion.  There is no increased warmth or erythema.  She has diffuse tenderness medially but more predominantly over the lateral joint line.  She can extend her knee to within 5 of full extension.  Any extension beyond that is met with increased lateral and posterior lateral discomfort.  She tolerates flexion to just beyond 90.  Again there is increased medial and lateral joint line pain at this point.  She has no peripatellar tenderness.  She has a negative apprehension test.  She has good range of motion about the left hip without any reproduction of her usual discomfort.  She does have increased medial as well as lateral pain with circumduction maneuvers.    IMAGING: Patient had undergone x-rays previously and these were reviewed.   These do not show any obvious acute bony abnormalities.    ASSESSMENT AND DIAGNOSIS: Left knee sprain with concern for underlying lateral meniscus tear    PLAN: Patient will undergo an MRI scan pending insurance approval to evaluate for a torn lateral meniscus.  She will follow up shortly after the MRI to discuss the findings.  She will call prior to that appointment with any problems or questions.  She was given crutches to assist with normalizing her gait.    ORDERS TODAY:    ORDERS NEXT VISIT:    PERCENT OF TEMPORARY IMPAIRMENT:

## 2012-05-07 ENCOUNTER — Encounter: Payer: Self-pay | Admitting: Orthopedic Surgery

## 2012-05-07 ENCOUNTER — Ambulatory Visit: Payer: Self-pay | Admitting: Orthopedic Surgery

## 2012-05-07 VITALS — BP 121/65 | Ht 70.0 in | Wt 170.0 lb

## 2012-05-07 DIAGNOSIS — S83006A Unspecified dislocation of unspecified patella, initial encounter: Secondary | ICD-10-CM

## 2012-05-07 NOTE — Patient Instructions (Signed)
Dear Jorje Guild,    Your physician has determined that you require durable medical equipment (DME) as a part of your treatment.  Knee braces, cast boots, walking boots, crutches, etc. Are DME.  These items offer protection and provide for your safety.  The type and quality of DME has been prescribed for you by your provider.      We cannot determine how much of the cost of this product will be paid by your insurance carrier.  Therefore, you may receive a bill for the outstanding balance.  It is your responsibility to pay whatever fee your insurance carrier does not.    DME Return Policy:     Braces and boots are not returnable if work outside of clinic due to hygiene concerns.   Poorly fitting braces can be exchanged for a correct fit within 1 week if the DME is in excellent condition.   DME that was not dispensed by [facility] will not be accepted.    If DME must be returned, it must be returned to the office that dispensed it.   Special order braces are billed at the time of order and are non-refundable.   Brace parts can be ordered and replaced if they become damaged or worn out.  This may include a charge.    Your type of brace: Other - Breg PTO Airmesh Knee Brace Left Large    Patient Signature: __________________________  05/07/2012

## 2012-05-07 NOTE — Progress Notes (Signed)
CHIEF COMPLAINT: Followup left knee    INTERVAL HISTORY: Patient returns for reevaluation of her left knee injury.  She was last seen roughly week ago and at that point clinical suspicion was made for possible meniscal injury.  She returns today stating that she is doing somewhat better, although still having persistent pain.  She is taking therapeutic doses of anti-inflammatories.  She continues to use her crutches to help normalize her gait.    PFSH:  Since last visit no change.    ROS:  Since last visit no change.    MEDICATION:  Since last visit no new meds.    ALLERGIES:  As per initial evaluation.    PHYSICAL EXAM: She remains tender to palpation along the medial and lateral joint line as well as the medial and lateral patella retinaculum.  She has no obvious effusion today.  She still lacks less than 5 of full extension.  She is flexing to just beyond 90.    IMAGING: We did review her MRI scan.  This does show some edema in the region of the medial patellar retinaculum.  There is no other bony or soft tissue abnormality.    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  She will wear this throughout the day and even sleep with it at night.  She will begin physical therapy at a facility close to home.  I will see her back in 3 weeks for progress check.  She will call prior to that appointment with any problems or questions.  She will continue with her anti-inflammatories.    ORDERS TODAY:    ORDERS NEXT VISIT:    PERCENT OF TEMPORARY IMPAIRMENT:

## 2012-05-30 ENCOUNTER — Ambulatory Visit: Payer: Self-pay | Admitting: Orthopedic Surgery

## 2012-06-07 ENCOUNTER — Ambulatory Visit: Payer: Self-pay | Admitting: Orthopedic Surgery

## 2012-09-03 ENCOUNTER — Telehealth: Payer: Self-pay

## 2012-09-03 NOTE — Telephone Encounter (Signed)
TC from pt who had a NOB appt scheduled for 09/25/12 as a new pt, LMP 07/31/12 G5P4. Pt stated that she starting bleeding on 08/31/12 and had cramping and bleeding over the weekend. UPT negative now. Pt experiencing cramping, tired and HA . States she has taken Tylenol/Ibuprofen as needed. Informed pt that we can change NOB to a NGY. She had appt cancelled when she called office and appt now rescheduled as a new gyn visit. Pt asked when she could resume IC and informed to wait until bleeding has stopped and that it would be favorable to have at least one menstrual cycle before pursuing conception again.

## 2012-09-25 ENCOUNTER — Ambulatory Visit: Payer: Self-pay | Admitting: Certified Nurse Midwife

## 2012-09-25 ENCOUNTER — Encounter: Payer: Self-pay | Admitting: Certified Nurse Midwife

## 2012-09-25 VITALS — BP 137/85 | Ht 69.5 in | Wt 180.0 lb

## 2012-09-25 DIAGNOSIS — O039 Complete or unspecified spontaneous abortion without complication: Secondary | ICD-10-CM

## 2012-09-25 DIAGNOSIS — Z01419 Encounter for gynecological examination (general) (routine) without abnormal findings: Secondary | ICD-10-CM

## 2012-09-25 LAB — POCT URINE PREGNANCY
Exp date: 8
Lot #: 104663

## 2012-09-25 NOTE — Progress Notes (Signed)
NEW GYN VISIT    Subjective:      CC:   Chief Complaint   Patient presents with   . Gynecologic Exam     NGY, follow up SAB.       HPI:   Melanie Curtis is a 37 y.o. 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 She returns today to establish care and discuss future pregnancy planning. Her LMP was 6/10 and she reports a + UPT. Then she had onset of moderate bleeding on 7/11 and had a neg UPT on 7/14  GYN HX:  Menarche: 37yo  Patient's last menstrual period was 08/31/2012., describes as flow is moderate, regular every 28 days without intermenstrual spotting and usually lasting less than 6 days  Sexually active: Yes    Sexarche age 62,  Last pap normal ,  Hx Abnormal Paps: yes   Breast disease hx: No  STI hx: No  Gardisil completed: No    BCM: no method,  Shedoes not use condoms regularly.   Past methods tried:    Abuse/Neglect Screen:  No    OB HX:  OB History   Gravida Para Term Preterm AB SAB TAB Ectopic Multiple Living   5 4 4  0 1 1 0 0 0 4      # Outcome Date GA Lbr Len/2nd Weight Sex Delivery Anes PTL Lv   5 SAB 08/2012 [redacted]w[redacted]d             Comments: chemical   4 TRM 03/07/10 108w4d  4536 g (10 lb) M SVD EPI N Y   3 TRM 10/14/07 [redacted]w[redacted]d 23:00 4536 g (10 lb) M SVD NONE- N Y   2 TRM 04/27/05 [redacted]w[redacted]d  3685 g (8 lb 2 oz) F SVD NONE- N Y      Comments: subchorionic hematoma, chronic abruption. Vaginal breech - failed version, then arrived in labor fully and pushing, refused c/s   1 TRM 01/02/03 [redacted]w[redacted]d  3997 g (8 lb 13 oz) M SVD  N Y          PMHx:  Past Medical History   Diagnosis Date   . Factor 5 Leiden mutation, heterozygous      heterozygous/ mother, m uncle, m aunt   . Abnormal Pap smear      once but normal on repeat, no tx       SurgHx:  Past Surgical History   Procedure Laterality Date   . Tonsillectomy and adenoidectomy         GYN Fam Hx:    Breast Ca  No.  Uterine ca No.  Ovarian Ca No.   Colon Ca  No.    Osteoporosis No.    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      Habits: no tobacco use and alcohol intake:4 glasses of wine per week(s)  Special Diet: no  Seatbelts: yes  Exercise: yes    Melanie Curtis  has a past medical history of Factor 5 Leiden mutation, heterozygous and Abnormal Pap smear.  Melanie Curtis  has past surgical history that includes Tonsillectomy and adenoidectomy.  Her family history includes Bleeding prob in her mother; No Known Problems in her father, maternal grandfather, maternal grandmother, paternal grandfather, paternal grandmother, and sister.  Melanie Curtis has a current medication list which includes the following prescription(s): prenatal multivit-min-fe-fa and acetaminophen-codeine.  Melanie Curtis is allergic  to sulfa drugs.    Review of Systems  A comprehensive review of systems was negative.     Objective:     Filed Vitals:    09/25/12 0911   BP: 137/85   Height: 1.765 m (5' 9.5")   Weight: 81.647 kg (180 lb)     Body mass index is 26.21 kg/(m^2).    General:  Thyroid: alert, cooperative and no distress  .No enlargement or nodules noted    Breasts:  inspection negative, no nipple discharge or bleeding, no masses or nodularity palpable   Lungs: clear to auscultation bilaterally   Heart:  regular rate and rhythm   Abdomen: soft, non-tender; bowel sounds normal; no masses,  no organomegaly    Vulva:  normal and external female genitalia   Vagina: normal vagina, no discharge   Cervix:  no lesions or discharge   Corpus: normal size, contour, position, consistency, mobility, non-tender   Adnexa:  normal adnexa   Rectal Exam: Not performed.  Perineal skin: normal                       Back:  negative                    Neuro:  Grossly normal  No results found for this or any previous visit (from the past 24 hour(s)).            Assessment:   Melanie Curtis is a 37 y.o. [phone] who had pos preg test at 4 wks of amenorrhea, then bleeding and neg test 3 weeks ago.      Plan:     1. Counseled pt to use condoms until she  has a normal period before attempting pregnancy again, but she has already had sex and wonders if she is already pregnant. She is motivated to get pregnancy as soon as possible.  2. Pregnancy test done - negative.  3. Pap was not due had neg pap 2012, but no hpv- Due in 2015 , CBE done  4. STI screening: none, STD risk reduction/condom use reviewed, HIV testing offered and declined  5. Contraception: none   6. Domestic violence screen/ safety assessment done- negative  7. Problem list reviewed and updated.   8. RTO in: prn pregnancy    Pricilla Riffle, CNM

## 2012-10-18 ENCOUNTER — Ambulatory Visit: Payer: Self-pay | Admitting: Obstetrics and Gynecology

## 2012-11-20 ENCOUNTER — Encounter: Payer: Self-pay | Admitting: Certified Nurse Midwife

## 2013-01-07 ENCOUNTER — Other Ambulatory Visit: Payer: Self-pay | Admitting: Obstetrics and Gynecology

## 2013-01-07 ENCOUNTER — Encounter: Payer: Self-pay | Admitting: Obstetrics and Gynecology

## 2013-01-07 ENCOUNTER — Ambulatory Visit: Payer: Self-pay | Admitting: Obstetrics and Gynecology

## 2013-01-07 VITALS — BP 118/68 | Ht 69.5 in | Wt 181.0 lb

## 2013-01-07 DIAGNOSIS — D6851 Activated protein C resistance: Secondary | ICD-10-CM

## 2013-01-07 DIAGNOSIS — Z3480 Encounter for supervision of other normal pregnancy, unspecified trimester: Secondary | ICD-10-CM | POA: Insufficient documentation

## 2013-01-07 DIAGNOSIS — O09529 Supervision of elderly multigravida, unspecified trimester: Secondary | ICD-10-CM

## 2013-01-07 DIAGNOSIS — IMO0001 Reserved for inherently not codable concepts without codable children: Secondary | ICD-10-CM | POA: Insufficient documentation

## 2013-01-07 LAB — POCT URINE PREGNANCY
Exp date: 8
Lot #: 104663
Preg Test,UR POC: POSITIVE — AB

## 2013-01-07 LAB — POCT URINALYSIS DIPSTICK
Blood,UA POCT: NEGATIVE
Exp date: 4
Glucose,UA POCT: NORMAL
Ketones,UA POCT: NEGATIVE
Leuk Esterase,UA POCT: 1 — AB
Lot #: 22606902
Nitrite,UA POCT: NEGATIVE
PH,UA POCT: 5 (ref 5–8)

## 2013-01-07 NOTE — Progress Notes (Signed)
INITIAL OB VISIT  Subjective:    Melanie Curtis is a 37 y.o. [phone] caucasian female that presents for a NOB appt today. Pt is accompanied by her husband, Melanie Curtis (ER MD, and their 4 children (waitied in waiting room during most of the visit).  Pregnancy was planned and is desired.  Patient's last menstrual period was 11/07/2012.   This was her first menses since having the SAB earlier this year. She has noticed some nausea.  Pt is currently taking PNV's.     They are desiring genetic testing and would like to discuss further.    PREGNANCY RISKS:  Patient Active Problem List   Diagnosis Code   . Factor 5 Leiden mutation, heterozygous 289.81   . Closed dislocation of patella 836.3   . Supervision of other normal pregnancy V22.1   . S/P breech extraction, currently pregnant V23.49   . AMA (advanced maternal age) multigravida 35+ 62.60     OBHX:  OB History   Gravida Para Term Preterm AB SAB TAB Ectopic Multiple Living   6 4 4  0 1 1 0 0 0 4      # Outcome Date GA Lbr Len/2nd Weight Sex Delivery Anes PTL Lv   6 CUR            5 SAB 08/2012 [redacted]w[redacted]d             Comments: chemical   4 TRM 03/07/10 [redacted]w[redacted]d  4536 g (10 lb) M SVD EPI N Y   3 TRM 10/14/07 [redacted]w[redacted]d 23:00 4536 g (10 lb) M SVD NONE- N Y   2 TRM 04/27/05 [redacted]w[redacted]d  3685 g (8 lb 2 oz) F BR NONE- N Y      Comments: subchorionic hematoma, chronic abruption. Vaginal breech - failed version x 2, then arrived in labor fully and pushing, refused c/s; short labor and easy delivery   1 TRM 01/02/03 [redacted]w[redacted]d  3997 g (8 lb 13 oz) M SVD EPI N Y      Comments: IOL for postdates; episiotomy          GYNHX:  Menstrual hx:  Patient's last menstrual period was 11/07/2012., regular every 28-30 days  STD hx none  Pap hx periods are regular  no unusual pelvic pain  no unusual vaginal discharge  has a history of previous abnormal Pap test many years ago which resolved spontaneously, last pap 2012 negative    Abuse/Neglect Screen:  No    GENETICS HX:  See separate genetics section    PMH:  Past  Medical History   Diagnosis Date   . Factor 5 Leiden mutation, heterozygous      heterozygous/ mother, m uncle, m aunt   . Abnormal Pap smear      once but normal on repeat, no tx; before marriage and children        PSH:  Past Surgical History   Procedure Laterality Date   . Tonsillectomy and adenoidectomy          MEDS:  Prior to Admission medications    Medication Sig Start Date End Date Taking? Authorizing Provider   Prenatal Multivit-Min-Fe-FA (PRENATAL #2 PO) Take 1 tablet by mouth daily.     Yes [provider]        See med reconciliation    ALLERGIES:  Allergies   Allergen Reactions   . Sulfa Drugs Rash        SOCHX:  History     Social History   .  Marital Status: Married     Spouse Name: Melanie Curtis     Number of Children: 3   . Years of Education: N/A     Occupational History   . Homemaker      Social History Main Topics   . Smoking status: Never Smoker    . Smokeless tobacco: Never Used   . Alcohol Use: No   . Drug Use: No   . Sexual Activity: Yes     Partners: Male     Birth Control/ Protection: None     Other Topics Concern   . Not on file     Social History Narrative    Lives with husband, Melanie Curtis (ER MD in [place] x 1 yr), and 4 children. 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  Together with Melanie Curtis x 11 years.  Pt exercising when she finds the time (goes swimming).  Last dental visit about 8 months ago.  Pt is at home with the children.        FAMHX:  Family History   Problem Relation Age of Onset   . No Known Problems Paternal Grandfather    . No Known Problems Paternal Grandmother    . No Known Problems Maternal Grandmother    . No Known Problems Maternal Grandfather    . No Known Problems Father    . Bleeding prob Mother      factor 5 leiden   . No Known Problems Sister    . Bleeding prob Maternal Uncle         ROS:  Negative except nausea    Objective:      Filed Vitals:    01/07/13 1500   BP: 118/68   Height: 1.765 m (5' 9.5")   Weight: 82.101 kg (181 lb)     Body mass index is 26.35  kg/(m^2).    See prenatal console    Recent Results (from the past 24 hour(s))   POCT URINALYSIS DIPSTICK    Collection Time     01/07/13  4:04 PM       Result Value Range    Specific gravity,UA POCT Test Not Performed  1.002 - 1.03    PH,UA POCT 5.0  5 - 8    Leuk Esterase,UA POCT +1 (*) Negative    Nitrite,UA POCT Negative  Negative    Protein,UA POCT Trace (*) Negative mg/dL    Glucose,UA POCT Normal  Normal    Ketones,UA POCT Negative  Negative    Urobilinogen,UA        Bilirubin,Ur    Negative    Blood,UA POCT Negative  Negative    Exp date 4 15      Lot # [id number]     POCT URINE PREGNANCY    Collection Time     01/07/13  4:04 PM       Result Value Range    Preg Test,UR POC Positive (*) Negative-Dilute urine specimens may cause false negative urine pregnancy results...    INTERNAL CONTROL POCT URINE PREGNANCY *Yes-internal procedural control(s) acceptable      Exp date 8 15      Lot # [id number]           A:   Mieshia is a 37 y.o. caucasian female [id number] at [redacted]w[redacted]d by LMP. S=D  1. Supervision of other normal pregnancy    2. S/P breech extraction, currently pregnant    3. AMA (advanced maternal age) multigravida 35+    4. Factor 5 Leiden mutation, heterozygous  P:  1. Pt oriented to [employer] practice and midwifery care, including MD consultants and delivery at [facility]. Reviewed routine care, common concerns, warning s/s, things to avoid and when/how to call. OB folder given and reviewed.  2. Prenatal profile, HIV, UA/C&S, pap smear, GC and CT, Hgb electro  3. Dating ordered - can do on same day as next visit if pt desires  4. Genetics referral - briefly discussed availability of NIPT as an option for testing  5. Recommended routine dental care  6. Reviewed warning s/s, when to call.  RTO 4 weeks and prn.    7. Tarri Fuller, MD re: if plan/consult needed for pt being heterozygous for Factor 5 Leiden    Silvestre Mesi, [place]

## 2013-01-08 LAB — AEROBIC CULTURE: Aerobic Culture: 0

## 2013-01-09 LAB — N. GONORRHOEAE DNA AMPLIFICATION: N. gonorrhoeae DNA Amplification: 0

## 2013-01-09 LAB — CHLAMYDIA PLASMID DNA AMPLIFICATION: Chlamydia Plasmid DNA Amplification: 0

## 2013-01-11 ENCOUNTER — Encounter: Payer: Self-pay | Admitting: Obstetrics and Gynecology

## 2013-01-15 LAB — HPV DNA PROBE WITH CYTOLOGY: HPV Hybrid Capture: POSITIVE

## 2013-01-16 LAB — GYN CYTOLOGY

## 2013-01-25 ENCOUNTER — Encounter: Payer: Self-pay | Admitting: Obstetrics and Gynecology

## 2013-01-25 DIAGNOSIS — IMO0002 Reserved for concepts with insufficient information to code with codable children: Secondary | ICD-10-CM | POA: Insufficient documentation

## 2013-01-25 DIAGNOSIS — Z0000 Encounter for general adult medical examination without abnormal findings: Secondary | ICD-10-CM | POA: Insufficient documentation

## 2013-01-28 ENCOUNTER — Ambulatory Visit
Admit: 2013-01-28 | Discharge: 2013-01-28 | Disposition: A | Discharge status: Home / Self Care | Payer: Self-pay | Source: Ambulatory Visit | Attending: Obstetrics and Gynecology | Admitting: Obstetrics and Gynecology

## 2013-01-28 ENCOUNTER — Other Ambulatory Visit: Payer: Self-pay | Admitting: Obstetrics and Gynecology

## 2013-01-28 ENCOUNTER — Ambulatory Visit: Payer: Self-pay

## 2013-01-28 ENCOUNTER — Ambulatory Visit
Admit: 2013-01-28 | Discharge: 2013-01-28 | Disposition: A | Discharge status: Home / Self Care | Payer: Self-pay | Source: Ambulatory Visit | Admitting: Obstetrics and Gynecology

## 2013-01-28 ENCOUNTER — Encounter: Payer: Self-pay | Admitting: Gastroenterology

## 2013-01-28 ENCOUNTER — Ambulatory Visit: Payer: Self-pay | Admitting: MS"

## 2013-01-28 DIAGNOSIS — Z3480 Encounter for supervision of other normal pregnancy, unspecified trimester: Secondary | ICD-10-CM

## 2013-01-28 DIAGNOSIS — O351XX0 Maternal care for (suspected) chromosomal abnormality in fetus, not applicable or unspecified: Secondary | ICD-10-CM

## 2013-01-28 LAB — TYPE AND SCREEN FOR PNP
ABO RH Blood Type: O POS
Antibody Screen: NEGATIVE

## 2013-01-29 LAB — PRENATAL PROFILE
Baso # K/uL: 0 10*3/uL (ref 0.0–0.1)
Basophil %: 0.2 % (ref 0.1–1.2)
Eos # K/uL: 0.1 10*3/uL (ref 0.0–0.4)
Eosinophil %: 2.2 % (ref 0.7–5.8)
HBV S Ag: NEGATIVE
Hematocrit: 39 % (ref 34–45)
Hemoglobin: 13.4 g/dL (ref 11.2–15.7)
Lymph # K/uL: 1.9 10*3/uL (ref 1.2–3.7)
Lymphocyte %: 30.9 % (ref 19.3–51.7)
MCH: 31 pg (ref 26–32)
MCHC: 35 g/dL (ref 32–36)
MCV: 90 fL (ref 79–95)
Mono # K/uL: 0.4 10*3/uL (ref 0.2–0.9)
Monocyte %: 5.6 % (ref 4.7–12.5)
Neut # K/uL: 3.8 10*3/uL (ref 1.6–6.1)
Platelets: 229 10*3/uL (ref 160–370)
RBC: 4.3 MIL/uL (ref 3.9–5.2)
RDW: 12.3 % (ref 11.7–14.4)
Rubella IgG AB: POSITIVE
Seg Neut %: 60.8 % (ref 34.0–71.1)
Syphilis Screen: NEGATIVE
Syphilis Status: NONREACTIVE
WBC: 6.3 10*3/uL (ref 4.0–10.0)

## 2013-01-29 LAB — HIV 1&2 ANTIGEN/ANTIBODY: HIV 1&2 ANTIGEN/ANTIBODY: NONREACTIVE

## 2013-01-30 ENCOUNTER — Encounter: Payer: Self-pay | Admitting: Obstetrics and Gynecology

## 2013-01-30 LAB — HEMOGLOBIN ELECTROPHORESIS
Hgb A1: 97.4 % (ref 96.8–97.8)
Hgb A2: 2.6 % (ref 2.2–3.2)
Interp,HBE: NORMAL

## 2013-01-30 LAB — HGB ELECT,REVIEW

## 2013-02-02 ENCOUNTER — Encounter: Payer: Self-pay | Admitting: Obstetrics and Gynecology

## 2013-02-04 ENCOUNTER — Encounter: Payer: Self-pay | Admitting: Certified Nurse Midwife

## 2013-02-05 ENCOUNTER — Ambulatory Visit: Payer: Self-pay | Admitting: Certified Nurse Midwife

## 2013-02-05 ENCOUNTER — Encounter: Payer: Self-pay | Admitting: Certified Nurse Midwife

## 2013-02-05 ENCOUNTER — Other Ambulatory Visit: Payer: Self-pay

## 2013-02-05 VITALS — BP 121/68 | Ht 69.5 in | Wt 183.0 lb

## 2013-02-05 DIAGNOSIS — Z3480 Encounter for supervision of other normal pregnancy, unspecified trimester: Secondary | ICD-10-CM

## 2013-02-05 DIAGNOSIS — O09529 Supervision of elderly multigravida, unspecified trimester: Secondary | ICD-10-CM

## 2013-02-05 DIAGNOSIS — IMO0001 Reserved for inherently not codable concepts without codable children: Secondary | ICD-10-CM

## 2013-02-05 DIAGNOSIS — D6851 Activated protein C resistance: Secondary | ICD-10-CM

## 2013-02-05 NOTE — Progress Notes (Signed)
Here with oldest son Melanie Curtis. Aware that NIPT results still pending (spoke with genetics by phone yesterday). NVP resolved.  Denies fam hx VTE. 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  O: pap wnl but + hpv. Fh at 3 above sp.  A: IUP 12.6 wks s=d   AMA with NIPT testing pending   FVL heterozygote  P: reviewed FVL status with Dr.PRessman. Given heterozygous, no hx VTE, no fam hx VTE, no alteration in care.   rto 4 wks  Dianna Rossetti, CNM

## 2013-02-08 ENCOUNTER — Telehealth: Payer: Self-pay | Admitting: MS"

## 2013-02-08 ENCOUNTER — Encounter: Payer: Self-pay | Admitting: Obstetrics and Gynecology

## 2013-02-08 LAB — MATERNAL T21 PLUS

## 2013-02-08 NOTE — Telephone Encounter (Signed)
02/08/13  Reached [place]; reviewed normal (NIPT) results consistent with the correct number of chromosomes 21, 13, 18 and sex chromsomes in the fetus.  These results very significantly reduce the risks for Down syndrome, trisomies 13 and 18, and sex chromosome problems, but cannot completely rule out these aneuploidies; fetal gender is XY female.  All questions were answered.      Rosezetta Schlatter, MS, CGC  Reproductive [place], [place]  [phone]

## 2013-02-21 NOTE — L&D Delivery Note (Signed)
Delivery Summary Note  Patient: Melanie Curtis  Age: 38 y.o.  Date of Birth: 09/08/1975  ZOX:[id number]:female  MRN: [id number]    Admission Summary  Date and time of admission: 08/09/2013  9:55 PM Attending Provider: Lucienne CapersJurichwright, Pamela R, * [facility] : [facility]  Active Hospital Problems    Diagnosis    NSVD (normal spontaneous vaginal delivery)    Spontaneous vaginal delivery    PROM (premature rupture of membranes)     Allergies:   Sulfa drugs  Weight:  PrePregnancy Weight: 77.111 kg (170 lb) Weight: 99.338 kg (219 lb) Pregnancy weight change (kg): 22.23 kg   Obstetric History    G6   P5   T5   P0   A1   TAB0   SAB1   E0   M0   L5       Breast or Formula Feeding: Breast feeding      Prenatal Labs  ABO RH Blood Type   Date Value Range Status   08/09/2013 O RH POS   Final        Antibody Screen   Date Value Range Status   08/09/2013 Negative   Final        Rubella IgG AB   Date Value Range Status   01/28/2013 POSITIVE   Final      TEST METHOD: Multiplex flow immunoassay        RPR Screen   Date Value Range Status   03/06/2010 NONREACT  NONREACT Final      TEST METHOD: Charcoal Particle Agglutination        HBV S Ag   Date Value Range Status   01/28/2013 NEG   Final      Test Method: CMIA        Group B Strep Culture   Date Value Range Status   07/16/2013 .   Final        HIV 1&2 ANTIGEN/ANTIBODY   Date Value Range Status   01/28/2013 Nonreactive   Final      Test Method: CMIA        HIV 1&2 Ab screen   Date Value Range Status   09/16/2009 NEG   Final      TEST METHOD: EIA      Dating Information  Patient's last menstrual period was 11/07/2012. EDD: 08/14/2013, by Last Menstrual Period  Information for the patient's newborn:  Melford AaseDewillers, Boy [[id number]][[id number]]     Delivery Information  Boy Stetzer  Sex: female Gestational Age: 4019w3d MRN: [id number] PCP: Loleta DickerVantol, Lois, MD   Delivery Date/Time: 08/10/2013 2:10 PM   Time of Head Delivery: 08/10/2013  2:10 PM  Delivery Type: Vaginal, Spontaneous Delivery            Delivery Location: labor flr      Labor  Onset Date/Time: 08/10/2013  9:00 AM  Dilation Complete Date/Time: 08/10/2013 2:10 PM      Preterm labor: No  Antenatal steroids: None Antibiotics received during labor: No       First Cervical ripening date/time:   /   Cervical ripening Type: Misoprostol        Rupture Date: 08/09/2013  Rupture Time: 12:00 AM   Details:   Rupture Type: Spontaneous  Color: Clear Amount: Moderate  Induction:     Indications:   Augmentation: None   Labor complications: None       Delivering clinician:  Murray-SCOTT, Calder Oblinger             Anesthesia Method: None-  Analgesics:         Presentation: Vertex  Position: Right  Occiput  Anterior   Prophylactic Maneuver: No     Shoulder Dystocia: No                                                         Resuscitation: Dry;Tactile Stimulation;Bulb Suctioning   Living Status: Yes            APGARs Total Color Reflex irritability Breath Heart Rate Muscle Tone Assigned By   (greater than 7 no need for next measurement)   1 min 8   1   2   1   2   2    Renae FicklePaul RN   5 min 9   1   2   2   2   2    Paul RN   10 min                       15 min                       20 min                       25 min                 30 min                   Birth Weight: 4388 g (9 lb 10.8 oz) Height: 22" Head Circumference: 36.8 cm Observed Anomalies:       Cord: 3 Vessels      Complications: Knot;Cord around        Cord around: neck     Cord tension: loose     Number of loops: 1       Interventions: reduced       Clamping Delayed: 1  Clamped Date/Time:    Cord blood disposition: Lab;Refrigerator      Gases sent: No        Stem cell collection -by MD-: No  Maternal Info:   Placenta Delivery Date/Time: 6/20  2:19 PM      Removal: Spontaneous      Appearance: Intact      Disposition: discarded  Bonding:     Stages of Labor:          Stage One:  5 h  10 m          Stage Two:  0 h  0 m          Stage Three:  0 h  9 m  Episiotomy: None            Perineal lacerations:                                    Delivery est. blood loss  (mL): 200.00        Needle Count: Correct        Sponge Count: Correct  Procedures: None            Membranes ruptured > 24 hours presented to hospital for induction and went into active labor after 2 doses of miso. Labored  in tub, NSVD of living baby boy , mom on hands and knees. Mom turned and baby placed on moms chest.. Perineum intact. Mom and baby stable and breast feeding.

## 2013-03-01 ENCOUNTER — Encounter: Payer: Self-pay | Admitting: Obstetrics and Gynecology

## 2013-03-01 ENCOUNTER — Ambulatory Visit: Payer: Self-pay | Admitting: Obstetrics and Gynecology

## 2013-03-01 VITALS — BP 122/61 | Ht 69.5 in | Wt 185.0 lb

## 2013-03-01 DIAGNOSIS — IMO0001 Reserved for inherently not codable concepts without codable children: Secondary | ICD-10-CM

## 2013-03-01 DIAGNOSIS — O09529 Supervision of elderly multigravida, unspecified trimester: Secondary | ICD-10-CM

## 2013-03-01 DIAGNOSIS — Z3480 Encounter for supervision of other normal pregnancy, unspecified trimester: Secondary | ICD-10-CM

## 2013-03-01 NOTE — Progress Notes (Signed)
Return OB  Subjective:    Luther ParodyCaitlin is here w/ daughter. Currently 5832w2d. States she is doing well. Reports +FM, No LOF, VB.  She had NIPT and it was WNL.  It is another boy.  Abuse/Neglect Screen:  Family or s/o in the room.  Need for follow up later.    Objective:    see prenatal vitals/result console  Filed Vitals:    03/01/13 1319   BP: 122/61   Height: 1.765 m (5' 9.5")   Weight: 83.915 kg (185 lb)     TWG = 2.268 kg (5 lb)    Assessment:    38 y.o. [id number] with IUP at 6332w2d, S=D          1. Supervision of other normal pregnancy    2. AMA (advanced maternal age) multigravida 35+    3. S/P breech extraction, currently pregnant      Plan:     Anatomic scan ordered   Declined AFP. NIPT was WNL   Reviewed warning s/s, when to call.     RTO 4 weeks and prn.    Brooke DareMelinda Kennady Zimmerle, CNM

## 2013-03-28 ENCOUNTER — Ambulatory Visit: Payer: Self-pay

## 2013-03-28 ENCOUNTER — Ambulatory Visit: Payer: Self-pay | Admitting: Obstetrics and Gynecology

## 2013-03-28 ENCOUNTER — Encounter: Payer: Self-pay | Admitting: Obstetrics and Gynecology

## 2013-03-28 VITALS — BP 109/59 | Ht 69.49 in | Wt 191.0 lb

## 2013-03-28 DIAGNOSIS — Z3480 Encounter for supervision of other normal pregnancy, unspecified trimester: Secondary | ICD-10-CM

## 2013-03-28 NOTE — Progress Notes (Signed)
Here with son.  +FM.  Had anatomy scan prior to this appt.  Excited to be having a boy.  Her [place] interviewing for a [place] in [place] and they are planning on moving down there in July.  Headed there next week to visit and interview in person.      Plan: 1) Await US results  2) RTC 4 wks

## 2013-04-23 ENCOUNTER — Ambulatory Visit: Payer: Self-pay | Admitting: Certified Nurse Midwife

## 2013-04-23 ENCOUNTER — Encounter: Payer: Self-pay | Admitting: Certified Nurse Midwife

## 2013-04-23 VITALS — BP 114/57 | Ht 69.5 in | Wt 196.0 lb

## 2013-04-23 DIAGNOSIS — Z3482 Encounter for supervision of other normal pregnancy, second trimester: Secondary | ICD-10-CM

## 2013-04-23 DIAGNOSIS — IMO0002 Reserved for concepts with insufficient information to code with codable children: Secondary | ICD-10-CM

## 2013-04-23 NOTE — Progress Notes (Signed)
Here with 38 yo  Melanie Curtis. + FM. Not concerned about baby measuring big (for whatever it means at 20 wks)--has had 2 10 lb babies. Plans move to [place] late July. (Husband has new job- ER MD).   O: wnl, anat scan limited  A: IUP 23 wks s=d   Hx babies with LGA  P: repeat US   rto 4 wks   1h glu, hct then  Melanie Curtis, CNM

## 2013-05-02 ENCOUNTER — Ambulatory Visit: Payer: Self-pay

## 2013-05-02 DIAGNOSIS — Z3482 Encounter for supervision of other normal pregnancy, second trimester: Secondary | ICD-10-CM

## 2013-05-17 ENCOUNTER — Ambulatory Visit
Admit: 2013-05-17 | Discharge: 2013-05-17 | Disposition: A | Discharge status: Home / Self Care | Payer: Self-pay | Source: Ambulatory Visit | Attending: Certified Nurse Midwife | Admitting: Certified Nurse Midwife

## 2013-05-17 DIAGNOSIS — Z3482 Encounter for supervision of other normal pregnancy, second trimester: Secondary | ICD-10-CM

## 2013-05-17 LAB — MCHC: MCHC: 33 g/dL (ref 32–36)

## 2013-05-17 LAB — HEMATOCRIT: Hematocrit: 37 % (ref 34–45)

## 2013-05-17 LAB — GLUCOSE TOLERANCE, 1 HOUR: Glucose,50gm 1HR: 134 mg/dL (ref 63–135)

## 2013-05-21 ENCOUNTER — Ambulatory Visit: Payer: Self-pay | Admitting: Certified Nurse Midwife

## 2013-05-21 ENCOUNTER — Encounter: Payer: Self-pay | Admitting: Certified Nurse Midwife

## 2013-05-21 VITALS — BP 117/65 | Ht 69.5 in | Wt 200.0 lb

## 2013-05-21 DIAGNOSIS — IMO0001 Reserved for inherently not codable concepts without codable children: Secondary | ICD-10-CM

## 2013-05-21 DIAGNOSIS — O09529 Supervision of elderly multigravida, unspecified trimester: Secondary | ICD-10-CM

## 2013-05-21 DIAGNOSIS — IMO0002 Reserved for concepts with insufficient information to code with codable children: Secondary | ICD-10-CM

## 2013-05-21 DIAGNOSIS — Z3480 Encounter for supervision of other normal pregnancy, unspecified trimester: Secondary | ICD-10-CM

## 2013-05-21 NOTE — Progress Notes (Signed)
Here with Melanie Curtis (38 yo) (Goes by [name] + FM. Lots of BH ctx. Not concerned about potential for large baby. Making plans for travel PP to Justin's new job. [place] then [place].  O: 1h glu 134  A: IUP 27.6 wks s=d   Hx LGA, with just barely nl glucose testing  P: we discussed option for US at around 36 wks   rto 2 wks  Dianna Rossettiebecca Rozelia Catapano, CNM

## 2013-06-06 ENCOUNTER — Ambulatory Visit: Payer: Self-pay | Admitting: Obstetrics and Gynecology

## 2013-06-06 ENCOUNTER — Encounter: Payer: Self-pay | Admitting: Obstetrics and Gynecology

## 2013-06-06 VITALS — BP 114/59 | Ht 69.5 in | Wt 206.0 lb

## 2013-06-06 DIAGNOSIS — Z3480 Encounter for supervision of other normal pregnancy, unspecified trimester: Secondary | ICD-10-CM

## 2013-06-06 NOTE — Progress Notes (Signed)
Here with her youngest son and her daughter.  Currently breastfeeding.  +FM.  Feeling well, has been packing up the [place] about a month after the baby is born.  Does not feel very stressed about it.    Plan: 1) RTC 2 wks

## 2013-06-20 ENCOUNTER — Ambulatory Visit: Payer: Self-pay | Admitting: Obstetrics and Gynecology

## 2013-06-20 ENCOUNTER — Encounter: Payer: Self-pay | Admitting: Obstetrics and Gynecology

## 2013-06-20 VITALS — BP 117/70 | Ht 69.5 in | Wt 208.0 lb

## 2013-06-20 DIAGNOSIS — Z3480 Encounter for supervision of other normal pregnancy, unspecified trimester: Secondary | ICD-10-CM

## 2013-06-20 DIAGNOSIS — R000 Tachycardia, unspecified: Secondary | ICD-10-CM | POA: Insufficient documentation

## 2013-06-20 DIAGNOSIS — IMO0002 Reserved for concepts with insufficient information to code with codable children: Secondary | ICD-10-CM

## 2013-06-20 NOTE — Progress Notes (Signed)
Here with Melanie Curtis and 4 children.  +FM.  Feeling well. Having occasional episodes, sometimes lasting as long as 10 minutes, where she feels that her heart is racing and "beating out of my chest" coupled with SOB.  Pt denies chest pain.  Melanie Curtis says her heart rate is always regular and in the 90's when he assesses.  Pt is willing to see cardio and have further w/u.    Wondering when to come to the hospital for labor.  Has been GBS positive in past pregnancies.    Plan: 1) Reviewed possibility of SVT's.  Will make cardio referral for further w/u.  Pt and husband amenable to this.  2) Reviewed when to call and come to the hospital.  Discussed it is difficult to manage multiparous labors based on GBS, if she is positive there is a good likelihood that she will not be adequately treated because of possibility of fast labor.  3) RTC 2 wks

## 2013-07-01 ENCOUNTER — Telehealth: Payer: Self-pay

## 2013-07-01 NOTE — Telephone Encounter (Signed)
Phone numbers given to patient to call Cardiology [phone], [phone] for appt . Patent requested HH. No one had called patient referral released 06/21/13.

## 2013-07-04 ENCOUNTER — Ambulatory Visit: Payer: Self-pay | Admitting: Obstetrics and Gynecology

## 2013-07-04 ENCOUNTER — Encounter: Payer: Self-pay | Admitting: Obstetrics and Gynecology

## 2013-07-04 VITALS — BP 106/66 | Ht 69.49 in | Wt 213.8 lb

## 2013-07-04 DIAGNOSIS — Z3483 Encounter for supervision of other normal pregnancy, third trimester: Secondary | ICD-10-CM

## 2013-07-04 NOTE — Patient Instructions (Signed)
 Common Discomforts: THIRD TRIMESTER    The last few weeks of your pregnancy are often the most uncomfortable.  Your growing baby is taking up more and more space.   Carrying around all that extra weight can make you more tired than usual.  As the baby and your uterus press up, down and out, other discomforts arise.     What are the most common discomforts during this last part of my pregnancy?  Most women have some or all of the following discomforts: edema (swelling), insomnia (unable to get to sleep or stay asleep), uncomfortable intercourse (sex), going to the bathroom a lot, shortness of breath and numbness or tingling in the fingers.   Discomforts you developed in your second trimester, such as leg cramps, constipation or hemorrhoids, may continue, get better or get worse.  Every woman is different and every pregnancy is different.     What can I do to prevent or relieve any discomforts?  No matter how careful you are, it is almost impossible to avoid all discomforts during your pregnancy.  Face it--carrying around a 6-10 pound baby and all that goes with it (placenta, uterus, bag of waters) in the tight space between your rib cage and your hips is bound to be a bit of a hassle!  There are many things you can do to make these last weeks easier on yourself.  This guide looks at each of most common discomforts.     Edema  Edema is swelling of any part of your body.  Your feet, ankles and hands most commonly swell during late pregnancy.  Sometimes swelling can be a sign of a problem.  (See the guide on Warning Signs-Second Trimester).  Sudden swelling, especially in your face or upper body, can be a warning sign.  Call your health care provider (HCP) right away if you have swelling in your face or above your waist.     Swelling also happens because your hormones make your blood vessels leak.  Also, the pressure of your baby on your hips sometimes keeps your blood from flowing well in your legs.  That is why your  feet and ankles may swell.  Eating normal amounts of salt on your food will not cause this, but taking salt pills or eating a lot of salt might make edema worse.    Here are some things to do that can help:   . Try not to wear tight clothing.  Tight waistbands or knee-high and thigh-high socks are especially a problem.  . Take rest periods often. Raise your legs higher than your heart if possible.  At the very least, put your feet up so they are level with your hips.  . Use support panty hose to help the blood flow in your legs  . Watch the amount of salt and salty foods in your diet.   . Call your HCP if the swelling keeps getting worse or happens very quickly.     Insomnia    Insomnia is not being able to sleep.  Sometimes people have trouble falling asleep.  Other people fall asleep, but wake up in the middle of the night and cannot get back to sleep.  Most women have changes in their sleep patterns during pregnancy.  Sometime the size of the baby makes it hard to find a comfortable position.   Pressure on your bladder may make you have to get up to go the bathroom at night.  Stress worrying about things  and being anxious can also keep you from sleeping well.    Here are some things that might help you sleep better:  . Avoid exciting activities before bedtime.   No relay races or scary movies!  . Take a warm bath.  . Try the side-lying position for rest and relaxation.  Some women prefer to support the upper leg with pillows.  . Read a dull book.  . Have someone give you a back rub or massage!  . Exercise before dinner or at least 3 hours before bedtime.   . Take a short nap during the day so you are not overly tired.  . Try not to drink things with caffeine, like coffee, tea, cola or other soda (read the labels).  . Sip on a glass of milk before bed or have a small bowl of cereal with milk.   Marland Kitchen Keep a pad by your bed and write down things that are bothering you.  Some women cannot get to sleep for fear they will  forget what they are thinking about.     If there are things that are worrying you, talk to someone about your worries.  Your HCP might be able to give you some other ideas too!    Discomforts during sex    During this last trimester, it can be more challenging to enjoy "being with" your special someone.  Lots of things make a difference in how much you enjoy sex.   Pressure from the growing baby and changes in your vagina make sex feel different.  Your larger belly can get in the way! How you feel and think can change your feelings about sex too.  It is okay to have sex, usually all the way up until your baby's birth.   You will not hurt the baby!    In some special cases, your HCP may tell you not to have sex or even sex play especially playing with nipples (this can lead to preterm labor).  Except in these special cases, though, you can enjoy sex as much as you and your partner like.        Here are some things that can help make you more comfortable:  Marland Kitchen Try different positions.  Lying on your side of often helps.  . Use a water soluble vagina gel, like K-Y Jelly, to keep things slippery.   Do not use petroleum jelly or mineral oil.  Look for water soluble on the package.  . Talk about any fears or concerns you have with your partner or your HCP.   . Tell your HCP if you have any symptoms of a vaginal infection (itching, burning, pain with sex, strange discharge).  Be sure to follow all instructions for treatment.   . Find other ways to express your feelings.  Try new ways to give your partner pleasure if the sex act itself gets too uncomfortable.  See if there are ways he can make you feel better without sex too.    Frequent urination    You may find yourself having to go to the bathroom a lot more often now.  The baby's head is pushing your bladder (urine storage area).  Your kidneys are also making more urine these days, because you have more blood flowing through them.    Sometimes you develop a urinary  infection.   If it happens, you will also find yourself    urinating more often.  But you may also notice burning  when you go and your urine may be cloudy or smell different than usual.  If any of these things happen, tell your HCP.  There are medicines that can help get rid of the infection and make you feel better.     Things you can do to be more comfortable are:  . Drink fluids often.  Do not cut back on fluids.  Do not drink all your liquids for 1 day at the same time.  Try carrying a large cup of water with you and sip on it often.   . Do not drink liquids with caffeine in them.  Too much caffeine may not be good for the baby.  Caffeine also makes you have to urinate more often.  Coffee and tea have caffeine--even the decaffeinated kind has a little.   Colas and other dark sodas often have caffeine.  Even some "clear" sodas have caffeine, so read the labels to see if caffeine is listed in the ingredients.  . Do Kegel exercises.   Squeeze the muscles in your bottom, then release.   Try to do 10 squeezes every time you think of it.   This will make it easier for you to hold your urine until you can get to a bathroom.    Shortness of breath    A feeling of having a hard time catching your breath occurs in this last trimester.  This is caused by the baby getting bigger and pushing up on your ribs and lungs.  This makes it hard for your lungs to stretch and let you take a deep breath.  Sometimes other problems can make this feeling worse.   If you have a cold with a fever, or if you have had other problems with your heart and lungs, tell your HCP if you get short of breath.  Otherwise here are some ways to breath easier:  . Do not do anything that you find makes you short of breath!  Try not to bend over for long periods.  Pace your exercise and walking so you do not have trouble catching your breath.  Take stairs more slowly.  . Do not wear clothing that is too tight.  Make sure you get larger bras and blouses as  you chest gets larger.   . If lying down makes it harder to breathe, add extra pillows under your head and back at night.   . Split up your activities to include more rest breaks.     Numbness or tingling

## 2013-07-04 NOTE — Progress Notes (Signed)
Return OB  Subjective:    Luther ParodyCaitlin is here alone. Currently 5518w1d. States she is doing well, going to call for cardio appt, hasn't yet. Feels sx's are 'the same'. Reports +FM, No LOF, VB or PTL s/s.  Says they are moving to [place] on 09/17/13. She is unsure of BCM, may want one more child. Hopes to have her PP visit before they leave for [place].     Objective:    see prenatal vitals/result console  Filed Vitals:    07/04/13 1546   BP: 106/66   Height: 1.765 m (5' 9.49")   Weight: 96.979 kg (213 lb 12.8 oz)     TWG = 15.332 kg (33 lb 12.8 oz)    Assessment:    38 y.o. [id number] with IUP at 3818w1d, S=D          1. Supervision of other normal pregnancy, third trimester      Plan:     Rev'd we can do PP visit before she leaves.    Rev'd BCM, may consider paraguard, pt unsure.   GBS cx next visit.    Reviewed warning s/s, when to call.  RTO 2 weeks and prn.    Phoebe SharpsMichele Jude Naclerio, CNM

## 2013-07-16 ENCOUNTER — Encounter: Payer: Self-pay | Admitting: Certified Nurse Midwife

## 2013-07-16 ENCOUNTER — Ambulatory Visit: Payer: Self-pay | Admitting: Certified Nurse Midwife

## 2013-07-16 VITALS — BP 124/74 | Ht 69.5 in | Wt 214.0 lb

## 2013-07-16 DIAGNOSIS — IMO0001 Reserved for inherently not codable concepts without codable children: Secondary | ICD-10-CM

## 2013-07-16 DIAGNOSIS — IMO0002 Reserved for concepts with insufficient information to code with codable children: Secondary | ICD-10-CM

## 2013-07-16 DIAGNOSIS — Z3483 Encounter for supervision of other normal pregnancy, third trimester: Secondary | ICD-10-CM

## 2013-07-16 DIAGNOSIS — R000 Tachycardia, unspecified: Secondary | ICD-10-CM

## 2013-07-16 DIAGNOSIS — O09529 Supervision of elderly multigravida, unspecified trimester: Secondary | ICD-10-CM

## 2013-07-16 NOTE — Progress Notes (Signed)
+  FM. Some ctx. More acheyness. Cardiac sx seem better. Has decided not to pursue eval now. Husband completed job at [employer], doing some locum tenans now. Plans condoms for bcm--may want another pregnancy  O: efw 6.5-7  A: IUP 35.6 s>d, but reasonable EFW to palp   Hx LGA   AMA-normal NIPT   Cardiac sx improved  P: rto every week   gbs today   Reviewed PVC sx, warnings. Agreeable to f/u if sx worsen or do not resolve PP.  Dianna Rossetti, CNM

## 2013-07-17 LAB — GROUP B STREP CULTURE: Group B Strep Culture: 0

## 2013-07-23 ENCOUNTER — Encounter: Payer: Self-pay | Admitting: Obstetrics and Gynecology

## 2013-07-23 ENCOUNTER — Ambulatory Visit: Payer: Self-pay | Admitting: Obstetrics and Gynecology

## 2013-07-23 VITALS — BP 118/65 | Ht 69.49 in | Wt 215.6 lb

## 2013-07-23 DIAGNOSIS — Z3483 Encounter for supervision of other normal pregnancy, third trimester: Secondary | ICD-10-CM

## 2013-07-23 NOTE — Progress Notes (Signed)
Return OB  Subjective:    Akyia is here with 4 children. Currently [redacted]w[redacted]d. States she is doing great getting ready for move to [place]. Reports +FM, No LOF, VB or PTL s/s.    Abuse/Neglect Screen:  Family or s/o in the room.  Need for follow up later.    Objective:    see prenatal vitals/result console  Filed Vitals:    07/23/13 1131   BP: 118/65   Height: 1.765 m (5' 9.49")   Weight: 97.796 kg (215 lb 9.6 oz)     TWG = 16.148 kg (35 lb 9.6 oz)    Vertex by bedside US scan  Assessment:    38 y.o. [id number] with IUP at [redacted]w[redacted]d, S>D          1. Supervision of other normal pregnancy, third trimester      Plan:     Reviewed GBS   Reviewed warning s/s, when to call.  RTO 1 weeks and prn.    Dimas Millin, CNM

## 2013-07-30 ENCOUNTER — Ambulatory Visit: Payer: Self-pay | Admitting: Certified Nurse Midwife

## 2013-07-30 ENCOUNTER — Encounter: Payer: Self-pay | Admitting: Certified Nurse Midwife

## 2013-07-30 VITALS — BP 127/59 | Ht 69.5 in | Wt 217.0 lb

## 2013-07-30 DIAGNOSIS — O0943 Supervision of pregnancy with grand multiparity, third trimester: Secondary | ICD-10-CM

## 2013-07-30 DIAGNOSIS — IMO0001 Reserved for inherently not codable concepts without codable children: Secondary | ICD-10-CM

## 2013-07-30 DIAGNOSIS — R000 Tachycardia, unspecified: Secondary | ICD-10-CM

## 2013-07-30 DIAGNOSIS — Z641 Problems related to multiparity: Secondary | ICD-10-CM | POA: Insufficient documentation

## 2013-07-30 DIAGNOSIS — IMO0002 Reserved for concepts with insufficient information to code with codable children: Secondary | ICD-10-CM

## 2013-07-30 DIAGNOSIS — O09529 Supervision of elderly multigravida, unspecified trimester: Secondary | ICD-10-CM

## 2013-07-30 DIAGNOSIS — Z3483 Encounter for supervision of other normal pregnancy, third trimester: Secondary | ICD-10-CM

## 2013-07-30 NOTE — Progress Notes (Signed)
Here with all kids (youngest at breast). Melanie Curtis is sick, after 12 night shifts in a row. He'll be done for 3 weeks after this Friday night. Lots of ctx. + FM.   EFW 9 lb. + accels. cx post, firm, long 1.5  A: IUP 37.6, s>d, c/w hx LGA babies  P: rto 1 wk   Reviewed when to call.   Reviewed saline lock for LGA/grand multiparity   Not concerned about LGA given prior experiences.  Dianna Rossetti, CNM

## 2013-08-06 ENCOUNTER — Ambulatory Visit: Payer: Self-pay | Admitting: Obstetrics and Gynecology

## 2013-08-06 ENCOUNTER — Encounter: Payer: Self-pay | Admitting: Obstetrics and Gynecology

## 2013-08-06 VITALS — BP 133/79 | Ht 69.5 in | Wt 219.0 lb

## 2013-08-06 DIAGNOSIS — Z3483 Encounter for supervision of other normal pregnancy, third trimester: Secondary | ICD-10-CM

## 2013-08-06 DIAGNOSIS — O0943 Supervision of pregnancy with grand multiparity, third trimester: Secondary | ICD-10-CM

## 2013-08-06 DIAGNOSIS — IMO0001 Reserved for inherently not codable concepts without codable children: Secondary | ICD-10-CM

## 2013-08-06 DIAGNOSIS — R000 Tachycardia, unspecified: Secondary | ICD-10-CM

## 2013-08-06 DIAGNOSIS — O09529 Supervision of elderly multigravida, unspecified trimester: Secondary | ICD-10-CM

## 2013-08-06 DIAGNOSIS — IMO0002 Reserved for concepts with insufficient information to code with codable children: Secondary | ICD-10-CM

## 2013-08-06 NOTE — Progress Notes (Signed)
Return OB  Subjective:    Melanie Curtis is here alone. Currently 6441w6d. States she is doing well. Injured her knee the other day.. Reports +FM, No LOF, VB or PTL s/s.requesting cervical exam.    Abuse/Neglect Screen:  No    Objective:    see prenatal vitals/result console  Filed Vitals:    08/06/13 1537   BP: 133/79   Height: 1.765 m (5' 9.5")   Weight: 99.338 kg (219 lb)     TWG = 17.69 kg (39 lb)    Assessment:    38 y.o. [id number] with IUP at 2041w6d, S=D          1. Supervision of other normal pregnancy, third trimester    2. AMA (advanced maternal age) multigravida 35+    3. S/P breech extraction, currently pregnant    4. Grand multipara, third trimester    5. Racing heart beat    6. LGA (large for gestational age) fetus      Plan:     Advised pt to see PCP for knee injury   Reviewed warning s/s, when to call.  RTO 1 weeks and prn.    Melanie Curtis, CNM

## 2013-08-09 ENCOUNTER — Inpatient Hospital Stay
Admit: 2013-08-09 | Disposition: A | Discharge status: Home / Self Care | Payer: Self-pay | Source: Ambulatory Visit | Attending: Obstetrics and Gynecology | Admitting: Obstetrics and Gynecology

## 2013-08-09 ENCOUNTER — Telehealth: Payer: Self-pay | Admitting: Obstetrics and Gynecology

## 2013-08-09 ENCOUNTER — Encounter: Payer: Self-pay | Admitting: Obstetrics and Gynecology

## 2013-08-09 DIAGNOSIS — O4290 Premature rupture of membranes, unspecified as to length of time between rupture and onset of labor, unspecified weeks of gestation: Secondary | ICD-10-CM

## 2013-08-09 MED ORDER — SODIUM CHLORIDE 0.9 % INJ (FLUSH) WRAPPED *I*
3.0000 mL | Freq: Three times a day (TID) | Status: DC
Start: 2013-08-10 — End: 2013-08-10

## 2013-08-09 MED ORDER — MISOPROSTOL 25 MCG QUARTER TAB *I*
50.0000 ug | ORAL_TABLET | ORAL | Status: DC
Start: 2013-08-10 — End: 2013-08-10
  Administered 2013-08-10 (×2): 50 ug via ORAL
  Filled 2013-08-09 (×2): qty 2

## 2013-08-09 MED ORDER — PROMETHAZINE HCL 25 MG/ML IJ SOLN *I*
25.0000 mg | Freq: Once | INTRAMUSCULAR | Status: AC
Start: 2013-08-10 — End: 2013-08-10
  Administered 2013-08-10: 25 mg via INTRAMUSCULAR
  Filled 2013-08-09: qty 1

## 2013-08-09 MED ORDER — MORPHINE SULFATE 15 MG/ML IJ SOLN *I*
15.0000 mg | Freq: Once | INTRAMUSCULAR | Status: AC
Start: 2013-08-10 — End: 2013-08-10
  Administered 2013-08-10: 15 mg via INTRAMUSCULAR
  Filled 2013-08-09: qty 1

## 2013-08-09 NOTE — H&P (Addendum)
 OB Admission  Subjective:      CC: PROM @ 12 mn 08/09/13      HPI:  38 y.o. Z6X0960 at [redacted]w[redacted]d based on LMP presents with c/o SROM @ 12 MN on 08/09/13 for clear fluid. Having occ mild contx and some slight bloody show.    Onset of care at 8 5/7 weeks and has been seen x 13 visits. TWG 39 lbs    PREGNANCY RISKS:  AMA  Leiden Factor 5   Grand Multipara  PROM    OBHX:  Patient's last menstrual period was 11/07/2012.  OB History   Gravida Para Term Preterm AB SAB TAB Ectopic Multiple Living   6 4 4  0 1 1 0 0 0 4      # Outcome Date GA Lbr Len/2nd Weight Sex Delivery Anes PTL Lv   6 Current            5 SAB 08/2012 [redacted]w[redacted]d             Comments: chemical   4 Term 03/07/10 [redacted]w[redacted]d  4536 g (10 lb) M Vag-Spont EPI N Y   3 Term 10/14/07 [redacted]w[redacted]d 23:00 4536 g (10 lb) M Vag-Spont NONE- N Y   2 Term 04/27/05 [redacted]w[redacted]d  3685 g (8 lb 2 oz) F Vag-Breech NONE- N Y      Comments: subchorionic hematoma, chronic abruption. Vaginal breech - failed version x 2, then arrived in labor fully and pushing, refused c/s; short labor and easy delivery   1 Term 01/02/03 [redacted]w[redacted]d  3997 g (8 lb 13 oz) M Vag-Spont EPI N Y      Comments: IOL for postdates; episiotomy          GYNHX:  STIs  none  Abnormal paps Yes + HPV    PMH:  Past Medical History   Diagnosis Date   . Factor 5 Leiden mutation, heterozygous      heterozygous/ mother, m uncle, m aunt   . Abnormal Pap smear      once but normal on repeat, no tx; before marriage and children   . Encounter for sickle-cell screening 2014     normal        PSH:  Past Surgical History   Procedure Laterality Date   . Tonsillectomy and adenoidectomy          MEDS:  Prior to Admission medications    Medication Sig Start Date End Date Taking? Authorizing Provider   Prenatal Multivit-Min-Fe-FA (PRENATAL #2 PO) Take 1 tablet by mouth daily.     Yes [provider]   acetaminophen (TYLENOL) 325 MG tablet Take by mouth every 6 hours as needed for Pain    [provider]        See Med  Reconciliation    ALLERGIES:  Allergies   Allergen Reactions   . Sulfa Drugs Rash        SOCHX:  History     Social History   . Marital Status: Married     Spouse Name: Ruqayyah Lute     Number of Children: 3   . Years of Education: N/A     Occupational History   . Homemaker      Social History Main Topics   . Smoking status: Never Smoker    . Smokeless tobacco: Never Used   . Alcohol Use: No   . Drug Use: No   . Sexual Activity:     Partners: Male     Birth Control/  Protection: None     Other Topics Concern   . Not on file     Social History Narrative    Lives with husband, [name] (ER MD in [place] x 1 yr), and 4 children. 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  Together with [name] x 11 years.  Pt exercising when she finds the time (goes swimming).  Last dental visit about 8 months ago.  Pt is at home with the children.      History     Social History Narrative    Lives with husband, [name] (ER MD in [place] x 1 yr), and 4 children. 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  Together with [name] x 11 years.  Pt exercising when she finds the time (goes swimming).  Last dental visit about 8 months ago.  Pt is at home with the children.       FAMHX:  Family History   Problem Relation Age of Onset   . No Known Problems Paternal Grandfather    . No Known Problems Paternal Grandmother    . No Known Problems Maternal Grandmother    . No Known Problems Maternal Grandfather    . No Known Problems Father    . Bleeding prob Mother      factor 5 leiden   . No Known Problems Sister    . Bleeding prob Maternal Uncle         ROS:  Negative     Abuse/Neglect Screen:  No    Prenatal Labs  Prenatal Results        1st Trimester Date Time   ABO/Rh (Most recent)  O RH POS  01/28/13 1143   Antibody (0d to 27w 6 d)  Negative  01/28/13 1143   Rubella (0d to 45w)  POSITIVE  01/28/13 1143   HCT (0d to 14w)  39 % 01/28/13 1143   HGB (0d to 14w)  13.4 g/dL 09/60/45 4098   Plts (0d to 24w)  229 THOU/uL 01/28/13 1143   2nd Trimester Date Time   Glucose (14w to 32w)      HCT  (14w to 28w)  37 % 05/17/13 0854   HGB  (14w to 28w)      3rd Trimester Date Time   GBSS (28w to 45w)      GBS (28w to 45w)  .  07/16/13 1112   HCT (28w to 45w)      HGB (28w to 45w)      Antibody (28w to 45w)      Plts (24w to 45w)      Genetic Testing Date Time   Cystic Fibrosis (8w to 18w) (See Report) 09/16/09 1025   Sickle Cell  (8w to 18w)      Other Tests Date Time   Urine Protein (0d to 45w)      Creatinine 24h UR  (0d to 45w)      BUN  (0d to 45w)      Syphilis Screen  (0d to 45w)  Neg  01/28/13 1143   RAPIDHIV  (0d to 45w)      HIV1&2 ANT/AB (0d to 45w)  Nonreactive  01/28/13 1143   HIV1/2AB (0d to 45w)  NEG  09/16/09 1025   TB  (0d to 45w)      HSV  (0d to 45w)      HBsAg  (0d to 45w)  NEG  01/28/13 1143   TSH  (0d to 45w)      Gonorrhea  (0d to 45w)  Chlamydia  (0d to 45w)  .  01/07/13 1620   PAP  (0d to 45w)  Clinical Data  Source Name: CERVICAL PAP SMEAR, LIQUID BASED SCRN # of Monolayers: 1 # of Slides: 1   Pregnant: Y Diagnostic or Screen: Screening   SPECIMEN ADEQUACY: Satisfactory for Evaluation  GENERAL CATEGORIZATION: Negative for intraepithelial lesion or malignancy.  COMMENT:  By physician request, this case has been selected for Human Papilloma Virus testing.  This specimen has been analyzed with the aid of the ThinPrep Imaging System.  Every slide is reviewed by a cytotechnologist and any detected potential cellular abnormality is interpreted by a pathologist.                         RELATED LABORATORY RESULTS  Test Name               Collected D and T       Result HPV HYBRID CAPTURE      01/07/2013 16:13        POSITIVE  Test Information: Human Papillomavirus (HPV) High Risk A positive high-risk HPV test result indicates that the patient may be infected with one or more of the following HPV genotypes: 16, 18, 31, 33, 35, 39, 45, 51, 52, 56, 58, 59, and 68, which are associated with cervical cancer and its precursor lesions.  However, cross-reactions with other genotypes may occur.   Results should be correlated with cytologic/histologic findings.    Cytotechnologist:                      Electronic Signature: JOHN PLAVNICKY, BS, CT(ASCP)           MARYANN RUTKOWSKI, BS, CT(ASCP) MARYANN RUTKOWSKI, BS, CT(ASCP)        01/16/2013  01/07/13 1613   1hr Glucola  134 mg/dL 16/10/96 0454   3hr OGTT      C&S   .  01/07/13 1620          Legend: +: Historical            View all results for this pregnancy                     OB Ultrasound:  Last scan 05/02/13  EFW 888 Gm  AFI wnl  Placenta posterior      Objective:      Patient Vitals for the past 24 hrs:   BP Temp Temp src Pulse Resp   08/09/13 2222 101/63 mmHg 36.8 C (98.2 F) TEMPORAL 123 16       General: healthy and no distress  HEENT: Normocephalic; atraumatic  Cardiovascular: Regular rate and rhythm with no murmurs  Respiratory: Clear to auscultate  Abdomen:  soft, nontender, normal bowel sounds  Presentation:  cephalic by palp  Neurological: Normal, average response (2+)  Extremities/Skin: Minimal edema of pedal and pretibial (1+)    Pelvic Exam:  Cervix: 1-2 cm dilated, 50 effaced, -3 station  Exam method:VE   SSE Not done  Membranes: ruptured, clear fluid  Pelvis appears: adequate for vaginal delivery     EFW:  Fundal Height: 3900  Estimated Fetal Weight: 3900  Estimated by: Leopold's     Fetal Assessment:  Baseline: 150 bpm   Variability: moderate                Accels: present Decels: absent  Cat  one    Toco:   irregular, every 5-10 minutes  Assessment:      38 y.o. Z6X0960 at [redacted]w[redacted]d by LMP admitted for PROM since 12 MN on 08/09/13, GBS neg, Membranes ruptured  FHR cat one    Patient Active Problem List   Diagnosis Code   . Factor 5 Leiden mutation, heterozygous 289.81   . Closed dislocation of patella 836.3   . Supervision of other normal pregnancy V22.1   . S/P breech extraction, currently pregnant V23.49   . AMA (advanced maternal age) multigravida 35+ 54.60   . Routine adult health maintenance V70.0   . HPV test positive 796.9, 079.4    . LGA (large for gestational age) fetus at 20 wks and 25 wks 766.1   . Racing heart beat 785.0   . Grand multipara 659.40   . PROM (premature rupture of membranes) 658.10   . PROM (premature rupture of membranes) 658.10       Risk of PPH:

## 2013-08-09 NOTE — Progress Notes (Signed)
Pt admitted to ob triage for ?rom. Pt states her water broke at midnight last night on 6/19. She has not had consistant ctx so she has chosen to stay  Home but was instructed to come in around 9 or 10 tonight for confirm rupture and nst. She says that the fluid coming out has been clear but a little pink tinged. She admits to fm. cnm notified of pt arrival.

## 2013-08-09 NOTE — Telephone Encounter (Signed)
1030: TC from Melanie Curtis 38 yo [id number] at 1542w2d to inform CNM that she broke BOW at 0030 this morning. Reports large gushes of clear fluid, changing pad frequently.  GBS negative.  Hoping for low intervention birth, wants to stay at home and await spontaneous labor. Reviewed warning s/s, when to call. She agrees to touch base w/ CNM later this afternoon.       1800: CNM called patient to touch base. Pt denies contractions, is nursing 38 yo frequently in an effort to stimulate labor. Baby is active, she continues to leak clear fluid. [facility]10 [facility] unless she needs to be seen sooner for labor check. She agrees. CNM and RN notified.     Warnell BureauJenney A Lety Cullens, CNM

## 2013-08-10 ENCOUNTER — Encounter: Payer: Self-pay | Admitting: Obstetrics and Gynecology

## 2013-08-10 LAB — CBC
Hematocrit: 35 % (ref 34–45)
Hemoglobin: 11.8 g/dL (ref 11.2–15.7)
MCH: 31 pg (ref 26–32)
MCHC: 34 g/dL (ref 32–36)
MCV: 92 fL (ref 79–95)
Platelets: 201 10*3/uL (ref 160–370)
RBC: 3.8 MIL/uL — ABNORMAL LOW (ref 3.9–5.2)
RDW: 13.3 % (ref 11.7–14.4)
WBC: 9.6 10*3/uL (ref 4.0–10.0)

## 2013-08-10 LAB — TYPE AND SCREEN
ABO RH Blood Type: O POS
Antibody Screen: NEGATIVE

## 2013-08-10 LAB — SYPHILIS SCREEN
Syphilis Screen: NEGATIVE
Syphilis Status: NONREACTIVE

## 2013-08-10 LAB — HOLD SST

## 2013-08-10 MED ORDER — OXYCODONE-ACETAMINOPHEN 5-325 MG PO TABS *I*
1.0000 | ORAL_TABLET | ORAL | Status: DC | PRN
Start: 2013-08-10 — End: 2013-08-12

## 2013-08-10 MED ORDER — LACTATED RINGERS IV SOLN *I*
50.0000 mL/h | INTRAVENOUS | Status: DC
Start: 2013-08-10 — End: 2013-08-12
  Administered 2013-08-10: 50 mL/h via INTRAVENOUS

## 2013-08-10 MED ORDER — PROMETHAZINE HCL 25 MG/ML IJ SOLN *I*
25.0000 mg | Freq: Once | INTRAMUSCULAR | Status: AC
Start: 2013-08-10 — End: 2013-08-10
  Administered 2013-08-10: 25 mg via INTRAMUSCULAR
  Filled 2013-08-10: qty 1

## 2013-08-10 MED ORDER — NALOXONE HCL 0.4 MG/ML IJ SOLN *WRAPPED*
Status: DC
Start: 2013-08-10 — End: 2013-08-10
  Filled 2013-08-10: qty 1

## 2013-08-10 MED ORDER — ACETAMINOPHEN 325 MG PO TABS *I*
650.0000 mg | ORAL_TABLET | ORAL | Status: DC | PRN
Start: 2013-08-10 — End: 2013-08-12

## 2013-08-10 MED ORDER — OXYTOCIN 30 UNITS IN 500ML NS WRAPPED *I*
INTRAMUSCULAR | Status: DC
Start: 2013-08-10 — End: 2013-08-10
  Administered 2013-08-10: 100 m[IU]/min via INTRAVENOUS
  Filled 2013-08-10: qty 500

## 2013-08-10 MED ORDER — IBUPROFEN 600 MG PO TABS *I*
600.0000 mg | ORAL_TABLET | Freq: Four times a day (QID) | ORAL | Status: DC | PRN
Start: 2013-08-10 — End: 2013-08-12
  Administered 2013-08-10 – 2013-08-12 (×7): 600 mg via ORAL
  Filled 2013-08-10 (×7): qty 1

## 2013-08-10 MED ORDER — MORPHINE SULFATE 15 MG/ML IJ SOLN *I*
15.0000 mg | Freq: Once | INTRAMUSCULAR | Status: AC
Start: 2013-08-10 — End: 2013-08-10
  Administered 2013-08-10: 15 mg via INTRAVENOUS
  Filled 2013-08-10: qty 1

## 2013-08-10 MED ORDER — OXYTOCIN 30 UNITS/500 ML NS *POSTPARTUM* *I*
100.0000 m[IU]/min | INTRAMUSCULAR | Status: AC
Start: 2013-08-10 — End: 2013-08-10

## 2013-08-10 MED ORDER — OXYCODONE-ACETAMINOPHEN 5-325 MG PO TABS *I*
2.0000 | ORAL_TABLET | ORAL | Status: DC | PRN
Start: 2013-08-10 — End: 2013-08-12
  Administered 2013-08-10 – 2013-08-12 (×10): 2 via ORAL
  Filled 2013-08-10 (×10): qty 2

## 2013-08-10 MED ORDER — MISOPROSTOL 200 MCG PO TABS *I*
ORAL_TABLET | ORAL | Status: DC
Start: 2013-08-10 — End: 2013-08-10
  Filled 2013-08-10: qty 4

## 2013-08-10 NOTE — Discharge Summary (Signed)
Admit date: 08/09/2013         Discharge date and time: No discharge date for patient encounter.  Admitting Physician: Lucienne CapersPamela R Jurichwright, CNM   Discharge Physician: Dimas MillinHelene [place]-Scott, CNM     History of Present Illness:   Melanie Curtis is a 38 y.o. [id number] at 6853w3d weeks and estimated date of delivery of 08/14/2013, by Last Menstrual Period who presented with Rupture of Membranes   She has Factor 5 Leiden mutation, heterozygous; Closed dislocation of patella; Supervision of other normal pregnancy; S/P breech extraction, currently pregnant; AMA (advanced maternal age) multigravida 35+; Routine adult health maintenance; HPV test positive; LGA (large for gestational age) fetus at 20 wks and 25 wks; Racing heart beat; Grand multipara; PROM (premature rupture of membranes); PROM (premature rupture of membranes); and NSVD (normal spontaneous vaginal delivery) on her problem list.    OB History   Gravida Para Term Preterm AB SAB TAB Ectopic Multiple Living   6 4 4  0 1 1 0 0 0 4      # Outcome Date GA Lbr Len/2nd Weight Sex Delivery Anes PTL Lv   6 Current            5 SAB 08/2012 7958w0d             Comments: chemical   4 Term 03/07/10 9743w4d  4536 g (10 lb) M Vag-Spont EPI N Y   3 Term 10/14/07 4059w0d 23:00 4536 g (10 lb) M Vag-Spont NONE- N Y   2 Term 04/27/05 2219w0d  3685 g (8 lb 2 oz) F Vag-Breech NONE- N Y      Comments: subchorionic hematoma, chronic abruption. Vaginal breech - failed version x 2, then arrived in labor fully and pushing, refused c/s; short labor and easy delivery   1 Term 01/02/03 5259w0d  3997 g (8 lb 13 oz) M Vag-Spont EPI N Y      Comments: IOL for postdates; episiotomy           Melanie Curtis  has a past medical history of Factor 5 Leiden mutation, heterozygous; Abnormal Pap smear; and Encounter for sickle-cell screening (2014). She  has past surgical history that includes Tonsillectomy and adenoidectomy. Melanie Curtis is allergic to sulfa drugs. Her family history includes Bleeding prob  in her maternal uncle and mother; No Known Problems in her father, maternal grandfather, maternal grandmother, paternal grandfather, paternal grandmother, and sister. She  reports that she has never smoked. She has never used smokeless tobacco. She reports that she does not drink alcohol or use illicit drugs..    Admission Diagnoses: PROM   Discharge Diagnoses:   Spontaneous vaginal delivery       Admission Condition: good  Discharged Condition: good    Hospital Course:   Uncomplicated Labor and Delivery needed stimulation of contractions given 2 doses of miso and had spontaneous labor , delivered on hands and knees. CANx1 easily reduced. [facility]    Hospital Problem List:  ACTIVE    Diagnosis Date Noted    NSVD (normal spontaneous vaginal delivery) [650] 08/10/2013    PROM (premature rupture of membranes) [658.10] 08/09/2013      RESOLVED    Diagnosis Date Noted Date Resolved   No resolved problems to display.       Author: Dimas MillinHelene [place]-Scott, CNM  Note created: 08/10/2013  at: 3:45 PM

## 2013-08-10 NOTE — Progress Notes (Signed)
[facility]. Report received from Josephine IgoNicole Paul, RN. Writer assumed care. Pt oriented to room, call bell, hospital routine, BG protocol, use of bulb syringe,frequency of feedings and how to record. Nathanial RancherBarb Kimoni Pagliarulo, RN

## 2013-08-10 NOTE — Discharge Instructions (Signed)
[facility]     GENERAL POSTPARTUM INSTRUCTIONS  VAGINAL BIRTH    1. Activity: Restrict unnecessary physical activity for at least two weeks, then gradually increase your activity.  a. Do not become overtired.  For the first two weeks at home try to rest in the morning and in the afternoon.   (If you stay in your pajamas, people will bring you food.)  b. Limit your travel and driving during the first two weeks.  .    2. Diet: Eat a well balanced diet rich in protein and calcium.  Continue your prenatal vitamins and iron supplements until your post-partum appointment. Breastfeeding mothers: drink  8-oz of fluid at every breast-feeding.     3. Bathing:  You may shower or bathe as desired and wash your hair.   Many women notice increased hair loss in the post-partum period.    4. Bowel Habits: Avoid constipation.  Eat foods rich in fiber such as fruits, vegetables, and bran, and drink plenty of fluids.  If you have not had a bowel movement by your fourth day at home you may take mild laxative such as Milk of Magnesia.    5. Bleeding: It is entirely normal to have bloody vaginal discharge, called lochia, which varies from woman to woman and can last for as long as 4-6 weeks.  Some women have increased bleeding 10-14 days after the baby is born.  You may find that the amount of discharge increases as you stand up from a lying position, or after activity.  It is also normal for your vaginal flow to increase with breast-feeding.  If your bleeding becomes heavy, or has a foul odor, contains large clots, or is accompanied by severe cramps, please call us.      6. Care of stitches: Use your peri-bottle to keep the area clean.  You may also want to sit in the bathtub for 15 minutes 2-3 times a day if very sore.  You may continue to use the Dermoplast spray or other local anesthetic on your stitches, putting some also on your pads.  Change your pads often, more frequently on days your discharge is heavier.  If your  stitches become red, swollen, painful or open up call us.    7. Breasts:  Breastfeeding: Between 2 and 3 days after the birth your breasts will swell and you will begin to make milk.  The breasts may become hard and sore during this short period of engorgement (3-5 days).  To decrease your discomfort and make it easier for your baby to nurse, we recommend the following:  1. Nurse your baby frequently, every 2-3 hours  2. Wear a bra with good support  3. Use cold packs, such as packages of frozen peas, as soon as the swelling begins.  Apply 3-4 times a day for 20 minutes. Or place a cold green cabbage leaf over the breast to decrease engorgement.  4. Take Ibuprofen (Advil, Motrin) 600 mg every 6 hours around the clock to reduce inflammation.   5. Massage the breast gently before nursing and express some milk first to allow the baby to latch on more easily.  Try massaging in a warm shower.  6. Use a nipple cream, such as the lanolin-based Pure-Lan or Lansinoh (available at [facility], other pharmacies, and the [facility]).  You do not need to wash these creams off before nursing.  7. Expose you nipples to air several times a day, especially after a feeding.    8. Call the office if you have severe breast or nipple pain, or if your breast has red streaks or if your nipples are bleeding.    Bottle-feeding: Your breasts will probably become full and sore about 3 days after delivery.  There is no medicine to “dry up” your milk.  Bind your breasts tightly with a towel, ace bandage, or bra.  To decrease discomfort, use cold packs and take Ibuprofen.  The discomfort usually lasts only a few days.  Don't express milk.    8. Sexual Intercourse: Resume sexual intercourse only when vaginal bleeding has stopped and when you are comfortable. If intercourse is initially painful, you may need to wait for a bit longer (usually after your post-partum exam).  You may use additional lubrication such as K-Y jelly or Astroglide.  You may  wish to use condoms to decrease your risk of infection.      9. Menstruation:  Women who are bottle-feeding will resume their normal pattern of periods by 2-3 months.  The first several periods may be different than the ones before the baby.  Breast-feeding women will probably not begin periods until several months have passed.  Remember you may be able to get pregnant even before your first period, so use birth control.    10. Contraception:  REMEMBER it is possible to get pregnant as early as 1 month after your birth.  Contraceptive pills may be started shortly after delivery.  Other methods to be used before returning to the office are foams or condoms; both are available without a prescription.  If you want a diaphragm, it can usually be fitted at your post-partum visit.  If you want an Intrauterine Device (IUD), discuss with the midwife when you come in for your post-partum visit.  Some methods of birth control are not covered by your insurance (e.g. IUD's and diaphragms).  Please call your insurance carrier before you make an appointment.    11. Postpartum examination: Please call our office soon after hospital discharge to schedule an appointment in 4-6 weeks if you did not receive one prior to going home.    12. Employment: You may return to work after 4-6 weeks.  Should you wish to return sooner, please discuss this with one of the midwives           13. Call if the following occur:  1. Fever or chills. (Check your temperature with a thermometer before calling.)  2. Excessively heavy or prolonged bleeding (soaking a large pad every hour).  3. Frequency or burning with urination.  4. Swelling, tenderness with redness in one area of breast, cracked or bleeding nipples.  5. Marked depression or anxiety.  6. Severe abdominal pain not relieved by Tylenol or Ibuprofen.    BEFORE YOU CALL FOR ANY PROBLEM BE SURE TO HAVE THE TELEPHONE NUMBER OF YOUR PHARMACY AVAILABLE…AND PENCIL AND PAPER.      [facility]  ([place] Patients)  [phone]    [facility] ([address] Patients)  [phone]    [facility] Office  [phone]    Answering Service   [phone]      Visit our website at [email or web address] for helpful resources

## 2013-08-10 NOTE — Progress Notes (Signed)
Report and transfer of care given to Barbara Lucke RN at this time. Melanie Curtis M Kitai Purdom, RN

## 2013-08-10 NOTE — Progress Notes (Signed)
In to see pt, who has been resting comfortably. States she notes some more frequent contx. States they are slightly stronger.  O- FHTs 125, mod variability, + accels, no decels. Category 1 tracing  BP 110/67 T 36 P104  Contx irreg mild  VE - deferred @ this time.  A- PROM @ 39 3/7 weeks       IOL of labor  P- Continue miso       May repeat Morphine and Phenergan       Expectant management.

## 2013-08-10 NOTE — Progress Notes (Signed)
More pressure in tub  Cervix 8/80/-1  fhr-130 fetoscope  Will get out of tube soon  ConocoPhillipsHelene Advance-Scott, CNM

## 2013-08-10 NOTE — Progress Notes (Signed)
Now on hands and knees breathing with contractions   Feels since shower contractions more intense  FHR=156 intermittent auscaltation

## 2013-08-10 NOTE — Progress Notes (Signed)
Intrapartum note:    S: wants to get in tub contractions every 3 min breathing through them no pressure        O:  General: healthy, alert, active and mild distress  Patient Vitals for the past 2 hrs:   BP Temp Temp src Pulse   08/10/13 1205 118/71 mmHg - - 131   08/10/13 1130 - 36 C (96.8 F) Axillary -           Meds:  Current Facility-Administered Medications Ordered in Epic   Medication Dose Route Frequency Provider Last Rate Last Dose    naloxone (NARCAN) 0.4 MG/ML injection             misoprostol (CYTOTEC) 200 MCG tablet             Oxytocin 30 units/500 mL (PITOCIN) 30 Units/ 500 ml infusion SOLN             sodium chloride 0.9 % flush 3 mL  3 mL Intravenous Q8H Jurichwright, Reche DixonPamela R, CNM        misoprostol (CYTOTEC) tablet 50 mcg  50 mcg Oral Q4H Jurichwright, Reche Dixonamela R, CNM   50 mcg at 08/10/13 0449     No current Epic-ordered outpatient prescriptions on file.        Labs:  Recent Results (from the past 24 hour(s))   TYPE AND SCREEN    Collection Time     08/09/13 11:50 PM       Result Value Range    ABO RH Blood Type O RH POS      Antibody Screen Negative     CBC    Collection Time     08/09/13 11:50 PM       Result Value Range    WBC 9.6  4.0 - 10.0 THOU/uL    RBC 3.8 (*) 3.9 - 5.2 MIL/uL    Hemoglobin 11.8  11.2 - 15.7 g/dL    Hematocrit 35  34 - 45 %    MCV 92  79 - 95 fL    MCH 31  26 - 32 pg    MCHC 34  32 - 36 g/dL    RDW 16.113.3  09.611.7 - 04.514.4 %    Platelets 201  160 - 370 THOU/uL   SYPHILIS SCREEN    Collection Time     08/09/13 11:50 PM       Result Value Range    Syphilis Screen Neg      Syphilis Status Nonreact     HOLD SST    Collection Time     08/09/13 11:50 PM       Result Value Range    Hold SST HOLD TUBE           FHR:  Baseline: 152 bpm    Variability: moderate   Accelerations:present  Decelerations: absent  Category: 1 audible for 1.5 min    TOCO: Contractions regular, every 3 minutes    Cervical Exam:   Cervix: deferred    Fluid: Clear    A:   38 y.o. [id number] at 8368w3d admitted for  PROM > 24 hours now in active labor  Membranes: ruptured, clear fluid  Labor: active  FHR Category: 1    P:  Ok for tub   If contractions slow will consider options      ConocoPhillipsHelene Powhatan-Scott, CNM

## 2013-08-10 NOTE — Progress Notes (Signed)
Discussed Pitocin not really interested but will if needed  Cervix-3/70/-2  Approaching active labor   FHR=cat 1  Hold on Pitocin  ConocoPhillipsHelene [place]-Scott, CNM

## 2013-08-10 NOTE — Progress Notes (Signed)
Intrapartum note:    S: feels contractions were more intense and having to breath through them , not as much back discomfort. Wants to shower and eat a little something.    O:  General: healthy, alert, active and no distress  Patient Vitals for the past 2 hrs:   BP Temp Temp src Pulse Resp   08/10/13 0716 112/76 mmHg 35.7 C (96.3 F) Axillary 93 16           Meds:  Current Facility-Administered Medications Ordered in Epic   Medication Dose Route Frequency Provider Last Rate Last Dose    naloxone (NARCAN) 0.4 MG/ML injection             sodium chloride 0.9 % flush 3 mL  3 mL Intravenous Q8H Jurichwright, Reche DixonPamela R, CNM        misoprostol (CYTOTEC) tablet 50 mcg  50 mcg Oral Q4H Jurichwright, Reche Dixonamela R, CNM   50 mcg at 08/10/13 0449     No current Epic-ordered outpatient prescriptions on file.        Labs:  Recent Results (from the past 24 hour(s))   TYPE AND SCREEN    Collection Time     08/09/13 11:50 PM       Result Value Range    ABO RH Blood Type O RH POS      Antibody Screen Negative     CBC    Collection Time     08/09/13 11:50 PM       Result Value Range    WBC 9.6  4.0 - 10.0 THOU/uL    RBC 3.8 (*) 3.9 - 5.2 MIL/uL    Hemoglobin 11.8  11.2 - 15.7 g/dL    Hematocrit 35  34 - 45 %    MCV 92  79 - 95 fL    MCH 31  26 - 32 pg    MCHC 34  32 - 36 g/dL    RDW 16.113.3  09.611.7 - 04.514.4 %    Platelets 201  160 - 370 THOU/uL   HOLD SST    Collection Time     08/09/13 11:50 PM       Result Value Range    Hold SST HOLD TUBE           FHR:  Baseline: 142 bpm    Variability: moderate   Accelerations:present  Decelerations: absent  Category: 1    TOCO: Contractions regular, every 3-4 minutes    Cervical Exam:   Cervix: deferred    Fluid: Clear    A:   38 y.o. [id number] at 6449w3d admitted for PROM now with miso for induction  Membranes: ruptured, clear fluid  Labor: early  FHR Category: 1    P:  Will eat and shower   Plan Pitocin but may not be able to start  pitocin d/t unit census       ConocoPhillipsHelene Hobson-Scott, [place]

## 2013-08-11 NOTE — Progress Notes (Signed)
Postpartum Note     S: Patient currently PPD#1 s/p spontaneous vaginal  delivery and states she is doing well. Pt is currently breastfeeding infant without issue. Pt reports Minimal lochia without clots. Pain is well controlled. She is ambulating, voiding and tolerating po intake without difficulty.     Pt [facility]. Pt reports currently feeling well emotionally Pt is prepared for infant at home. Currently living with husband and 4 other children.     O:  Patient Vitals for the past 24 hrs:   BP Temp Temp src Pulse Resp SpO2   08/11/13 1148 103/69 mmHg 36.8 C (98.2 F) TEMPORAL 76 20 98 %   08/11/13 0706 116/81 mmHg 36.5 C (97.7 F) TEMPORAL 70 20 98 %   08/11/13 0350 118/76 mmHg 36.8 C (98.2 F) TEMPORAL 78 18 99 %   08/10/13 2324 120/66 mmHg 36.5 C (97.7 F) TEMPORAL 85 18 98 %   08/10/13 1950 109/55 mmHg 36.3 C (97.3 F) TEMPORAL 85 18 100 %   08/10/13 1601 114/61 mmHg 36.9 C (98.4 F) TEMPORAL 112 20 98 %   08/10/13 1531 112/70 mmHg - - 111 - -   08/10/13 1515 107/66 mmHg - - 108 - -   08/10/13 1500 117/51 mmHg - - 104 - -   08/10/13 1446 125/69 mmHg - - 103 - -       General appearance: healthy and no distress  Breasts: R-Soft and L-Soft  Abdomen/Fundus:  Fundus firm at umbilicus -1 nontender  Perineum:Intact, normal and Not assessed  Lochia:Not assessed  Rectum:not examined  Extremities/Skin: Minimal edema of pedal and pretibial (1+)   I/Os:  I/O last 3 completed shifts:  06/20 0700 - 06/21 0659  In: 240 (2.4 mL/kg) [I.V.:240 (0.1 mL/kg/hr)]  Out: 1250 (12.6 mL/kg) [Urine:1050 (0.4 mL/kg/hr); Blood:200]  Net: -1010  Weight: 99.3 kg      LABS:  No results found for this or any previous visit (from the past 24 hour(s)).      A:   38 y.o. [id number] PPD# 1 s/p normal spontaneous vaginal delivery   breastfeeding mom    P:   Continue current management.   PP d/c instructions from practice given and reviewed with pt. See orders and Patient Instructions    Med use reviewed encouraged to  continue PNV's.    Plans [facility]. Reviewed proper use of method and potential side effects.   Had Tdap vaccine.  Had Flu vaccine.     Circumcision consent discussed/reviewed/obtained noPt declines circ.    May d/c pt home PPD#2 if pt remains in stable condition. Plan for PP exam in 4-6wks as discussed.    Silvestre MesiHeather Avrie Kedzierski, CNM

## 2013-08-11 NOTE — Lactation Note (Signed)
This note was copied from the chart of Boy Horkey.  Mom experienced.  Reports breastfeeding well and comfortable latch.  Tandem breastfed other children for 10 years with no issues.  Has our contact information.  Will call PRN.  Manon Hildingebecca A Jago Carton, RN, IBCLC

## 2013-08-12 NOTE — Progress Notes (Signed)
Pt received d/c information/teaching and has no further questions. Is leaving ambulatory in stable condition. Is aware to make follow up apt upon returning home. Johnnye SimaMarah Shaneta Cervenka, RN

## 2013-08-27 ENCOUNTER — Telehealth: Payer: Self-pay

## 2013-08-27 NOTE — Telephone Encounter (Signed)
Unable to reach pt. For post-discharge phone call.

## 2013-09-10 ENCOUNTER — Ambulatory Visit: Payer: Self-pay | Admitting: Obstetrics and Gynecology

## 2013-09-10 ENCOUNTER — Encounter: Payer: Self-pay | Admitting: Obstetrics and Gynecology

## 2013-09-10 NOTE — Progress Notes (Signed)
POSTPARTUM VISIT    Subjective:       Melanie Curtis is a 38 y.o. [id number] female who presents for her postpartum visit. She had a spontaneous vaginal  delivery on August 10, 2013 at 39 3/[redacted] weeks EGA with a viable female named "Roland" weighing 9 lb 10 oz  Apgars 8/&9.  intact no repair. Used NCB  for pain management. Delivered with Dimas MillinHelene Bunceton-Scott, CNM  , CNM.  Pleased with care.    Denies bowel or bladder issue. does Perform kegels. Bleeding has  stopped. breastfeeding infant and this is going well. Family adjusting to new addition. Pt has not resumed IC. She is using abst[facility] for [facility] and desires Initiate condoms. Denies s/s pp depression.    Abuse/Neglect Screen:  No    Last pap: negaive result 2014  Hx of abnormals: No    Patient's medications, allergies, past medical, surgical, social and family histories were reviewed and updated as appropriate.    Review of Systems  Pertinent items are noted in HPI.     Objective:        Filed Vitals:    09/10/13 1303   BP: 112/65   Height: 1.765 m (5' 9.5")   Weight: 89.812 kg (198 lb)        General:  alert, appears stated age and no distress    Breasts:  inspection negative, no nipple discharge or bleeding, no masses or nodularity palpable   Thyroid No enlargement or nodules noted       Abdomen: soft, non-tender; bowel sounds normal; no masses,  no organomegaly  DR 2 FB apart    Vulva:  normal and external female genetalia   Vagina: normal vagina, no discharge   Cervix:  no lesions or discharge   Corpus: normal size, contour, position, consistency, mobility, non-tender   Adnexa:  normal adnexa   Rectal Exam:  Perineum Normal rectovaginal exam     Healing well       Assessment:     Melanie Curtis is a 38 y.o. [id number] presenting for her postpartum exam s/p spontaneous vaginal delivery   Currently breast feeding  Contraceptive management    Plan:     1. Pap was not due   2. STI screening: none  3. Contraception: condoms: 100% of the time   4. Support: FOB is involved  and supportive, family is involved and supportive, Domestic violence:No.  5. Lab tests none  6. Referrals:none indicated   7. Problem list reviewed and updated.   8. Reviewed when & how to call, [place] when care established will request records    [address] Maryhill-Scott, CNM

## 2014-05-06 NOTE — Progress Notes (Signed)
Pt has history of abnormal pap smears.  Recall letters#1and # 2 were mailed.  Final third letter was sentl. Pt has not responded to our attempts to contact. Remains without appt. Reviewed with H.Montgomery Scott,CNM. No further action. Pt lost to followup at this time. Needs pap with next CNM appt.

## 2022-03-14 IMAGING — CT CT HEAD WITHOUT CONTRAST
2 of 3 series · 15 of 40 positions shown, 18 images · non-contrast
Comparison: None.

HEADACHE
UNABLE TO REMOVE JEWELRY
FINAL REPORT:
CT HEAD WITHOUT CONTRAST
INDICATION: Headache.
TECHNIQUE: CT of the brain was performed without contrast. Coronal and sagittal reformats were submitted for evaluation.
DLP: 950.18 mGy-cm

[Series 2: head stnd · axial · 0.48mm/px · z∈[+7,+159]mm · 12 of 36 slices shown, 15 images]
[im 3/36  brain]
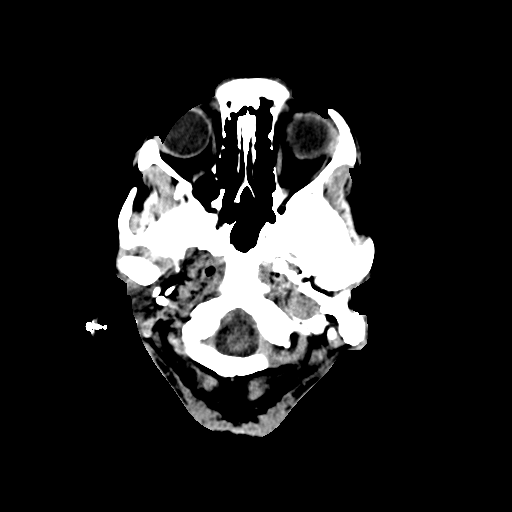
[im 3/36  bone]
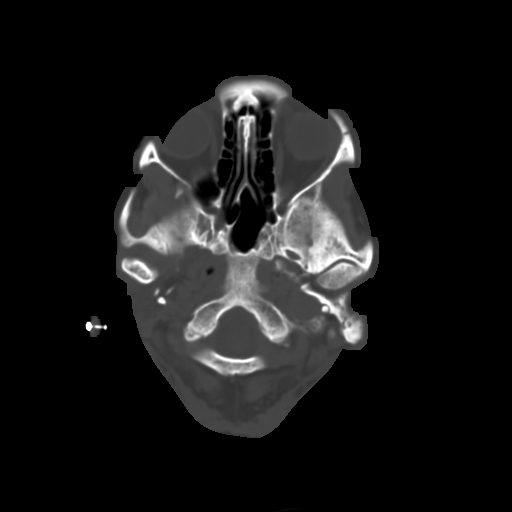
[im 5/36  brain]
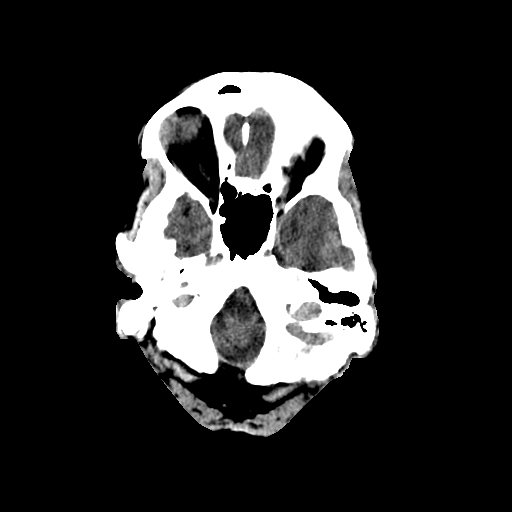
[im 8/36  brain]
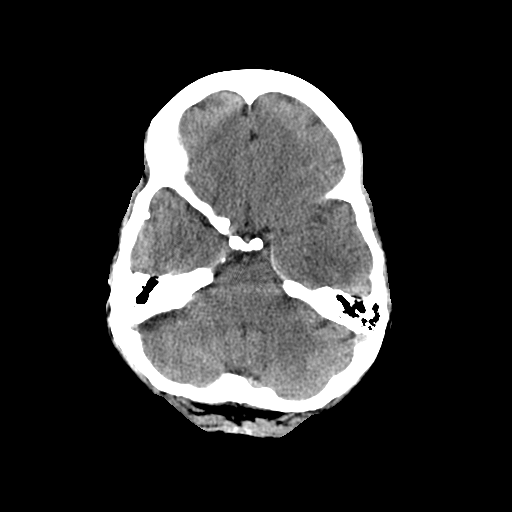
[im 11/36  brain]
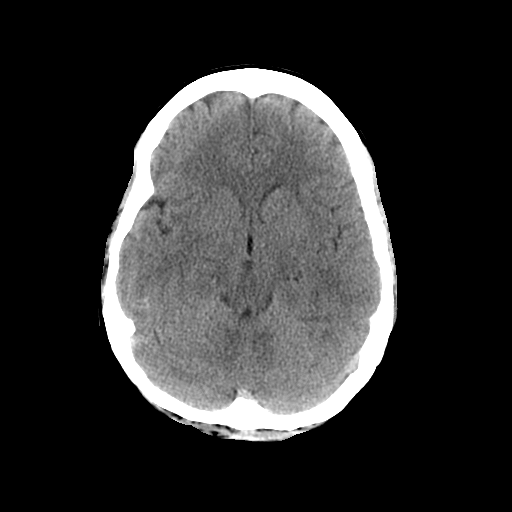
[im 14/36  brain]
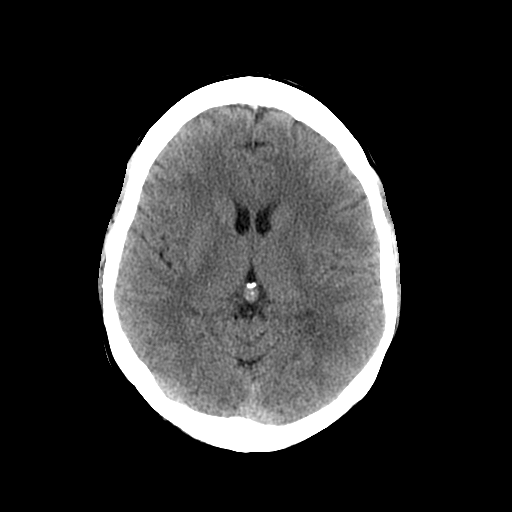
[im 14/36  bone]
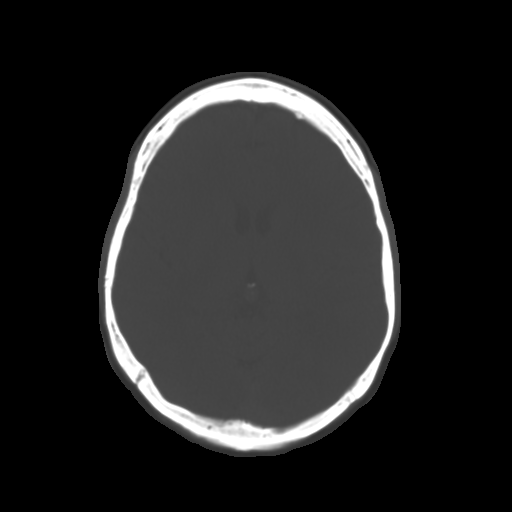
[im 16/36  brain]
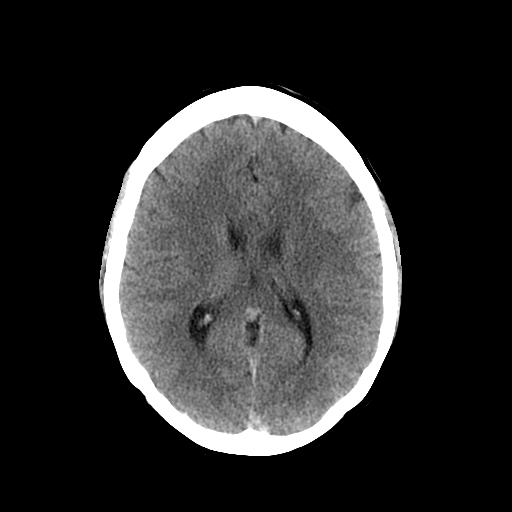
[im 20/36  brain]
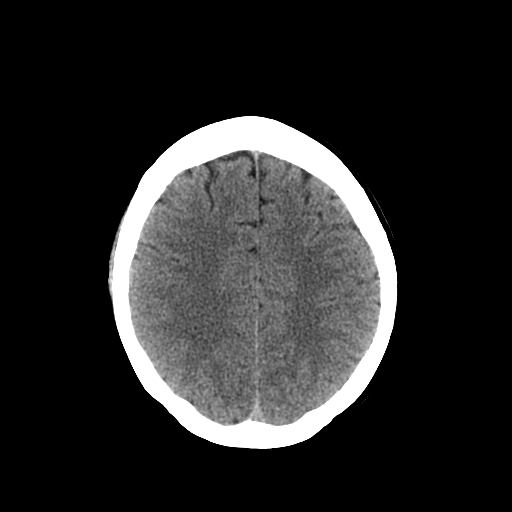
[im 22/36  brain]
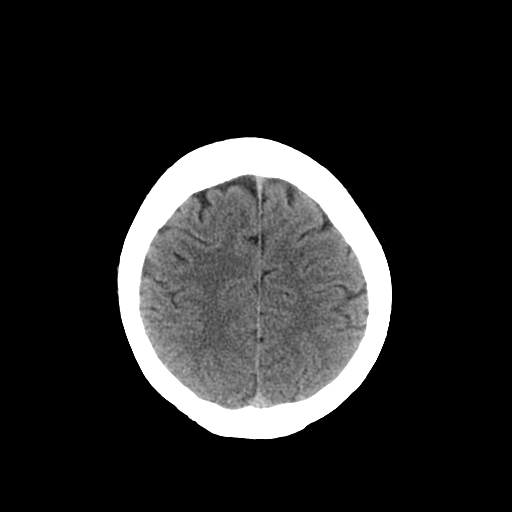
[im 25/36  brain]
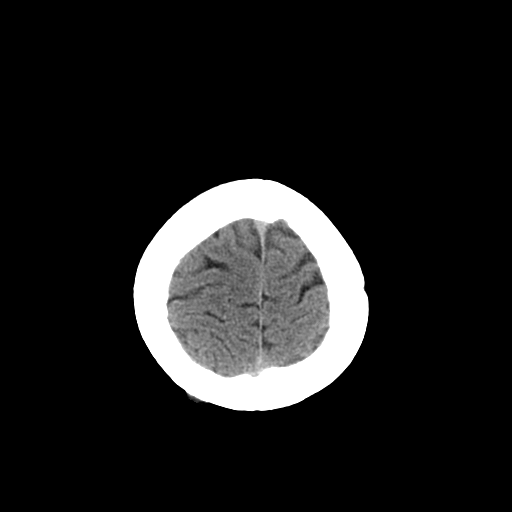
[im 25/36  bone]
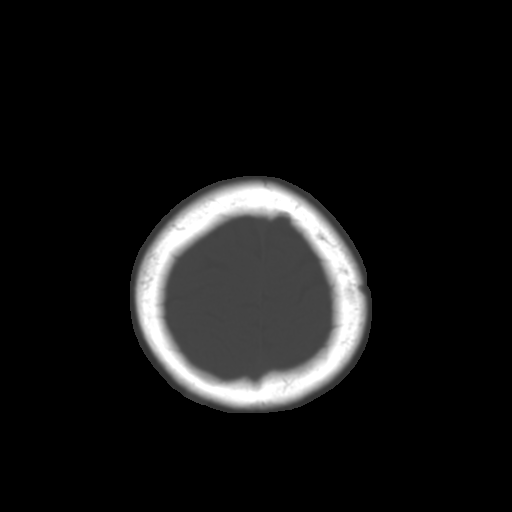
[im 28/36  brain]
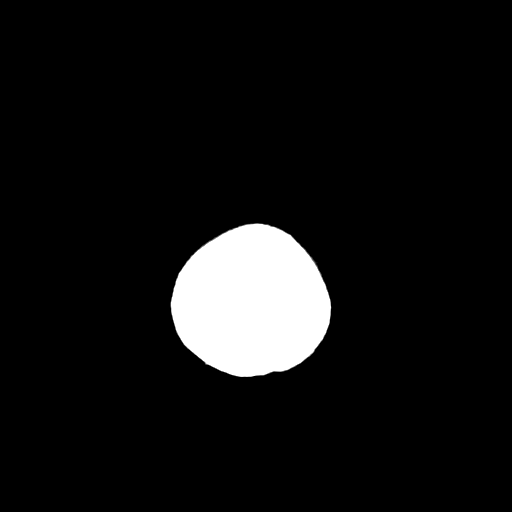
[im 31/36  brain]
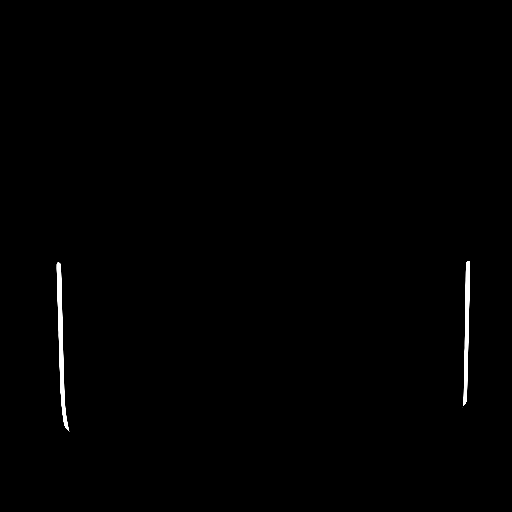
[im 33/36  brain]
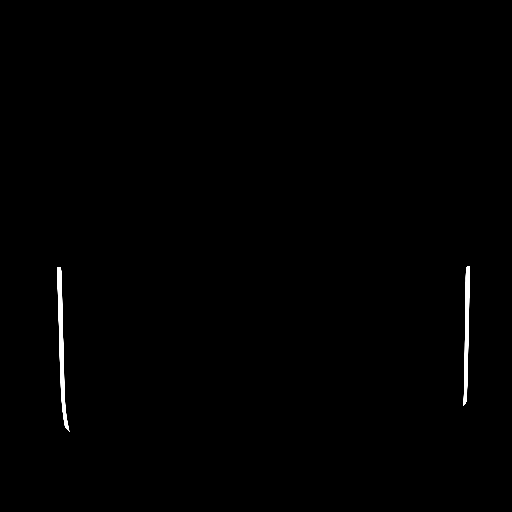

[Series 601: cor head · coronal · 0.48mm/px · 3 of 99 slices shown]
[im 20/99  brain]
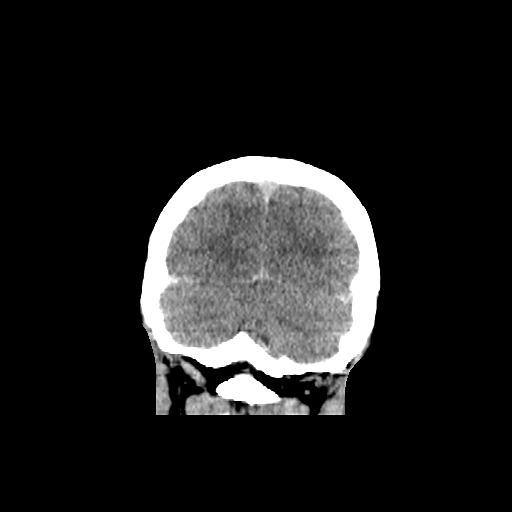
[im 40/99  brain]
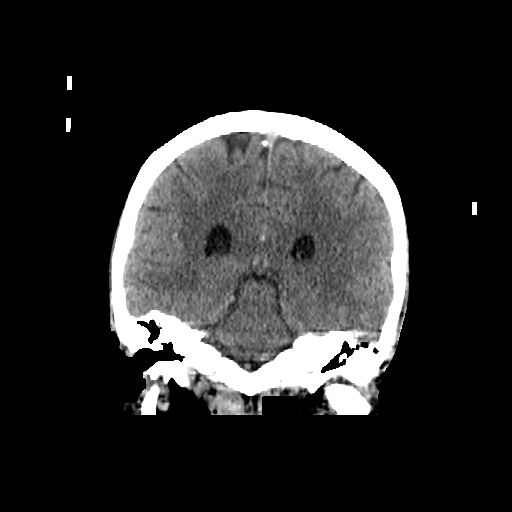
[im 59/99  brain]
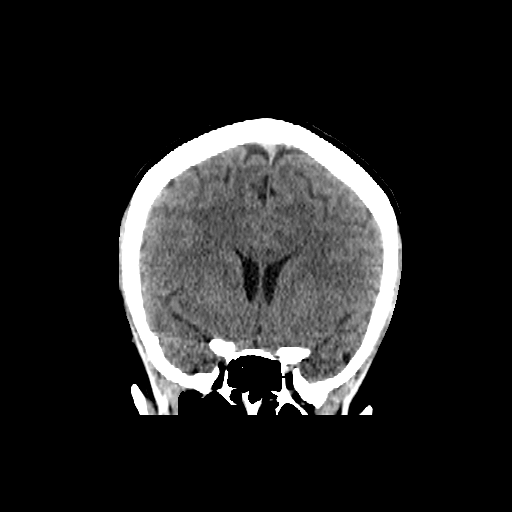

[15 of 40 positions shown; findings below may reference images not displayed]

FINDINGS: Brain volume is normal. No acute intracranial hemorrhage, mass effect, or hydrocephalus. No territorial hypodensities to suggest acute infarct. No hyperdense vessel. No extra-axial collections.
The orbits and globes are within normal limits. The right mastoid air cells are underpneumatized. No paranasal sinus opacification. No depressed skull fractures. No suspicious lytic or blastic lesions. There is a benign right parietal scalp lesion near the vertex (axial image 26).
IMPRESSION: 
IMPRESSION: No evidence of an acute intracranial process.
All CT scans at this facility use iterative reconstruction technique, dose modulation, and/or weight based dosing when appropriate to reduce radiation dose to as low as reasonably achievable.
Is the patient pregnant?
No

## 2022-03-14 IMAGING — CT CT ABDOMEN PELVIS WITH CONTRAST
2 of 3 series · 17 of 46 positions shown, 19 images · IV contrast (agent unspecified)
Comparison: None.

MID ABD PAIN N/V
FINAL REPORT:
CT ABDOMEN PELVIS WITH CONTRAST
INDICATION: Abdominal pain, nausea and vomiting.
TECHNIQUE: CT of the abdomen and pelvis was performed with intravenous contrast. Coronal and sagittal reformats were submitted for evaluation.
DLP: 797.43 mGy-cm

[Series 2: abdomen/pel with · axial · 0.87mm/px · z∈[-498,-63]mm · 14 of 200 slices shown, 16 images]
[im 13/200  soft-tissue]
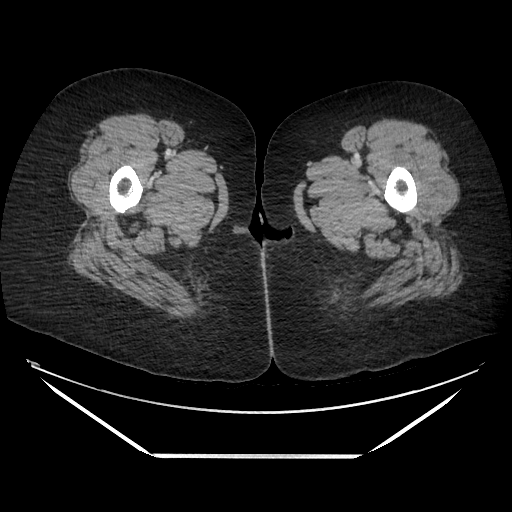
[im 13/200  bone]
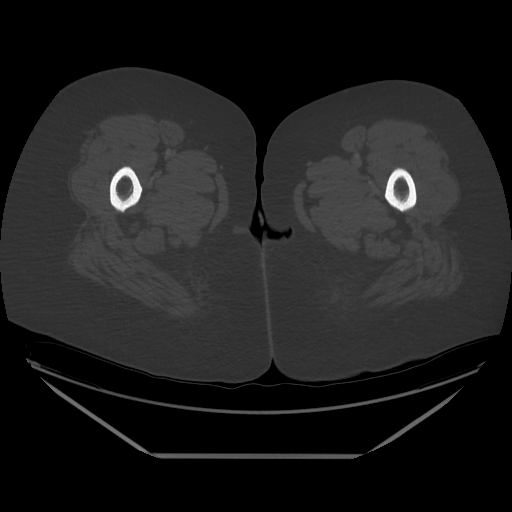
[im 26/200  soft-tissue]
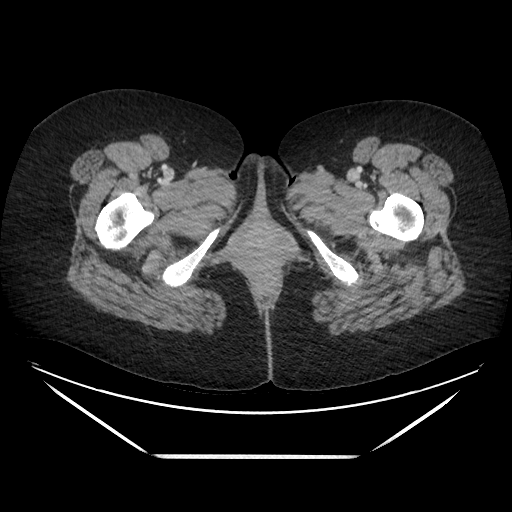
[im 39/200  soft-tissue]
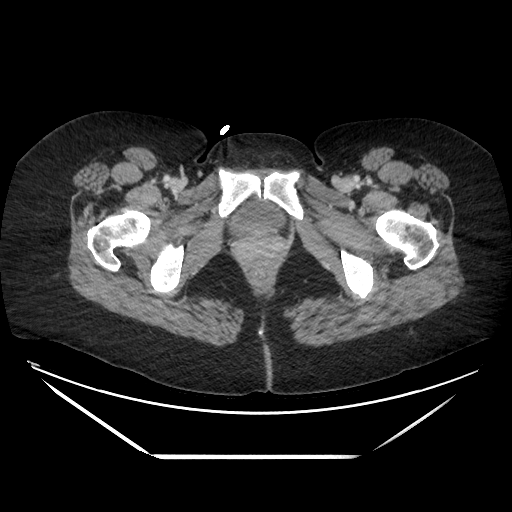
[im 52/200  soft-tissue]
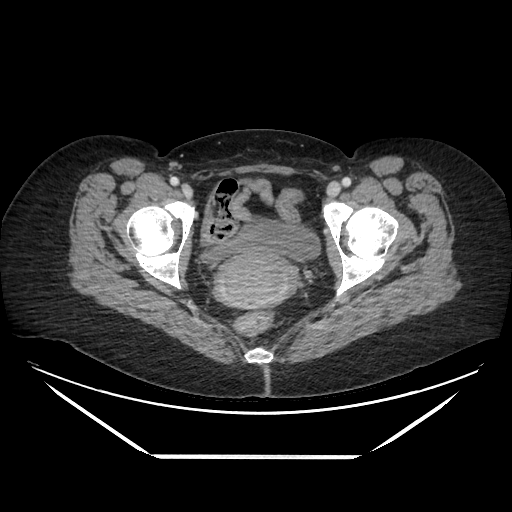
[im 65/200  soft-tissue]
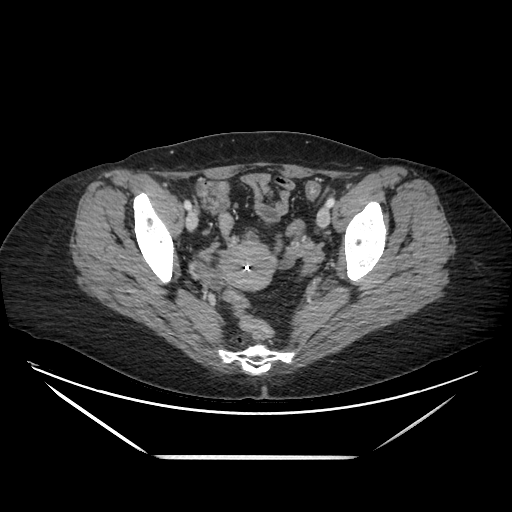
[im 78/200  soft-tissue]
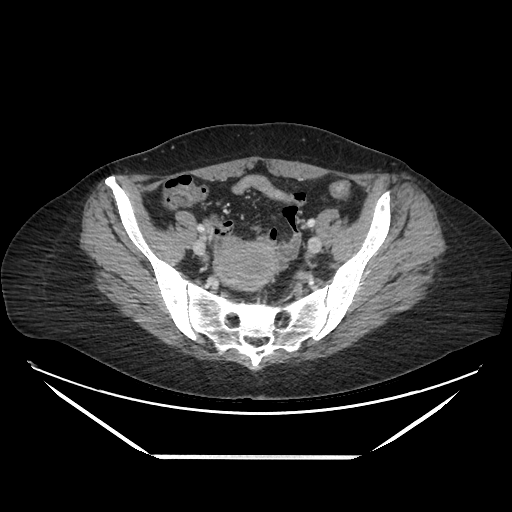
[im 90/200  soft-tissue]
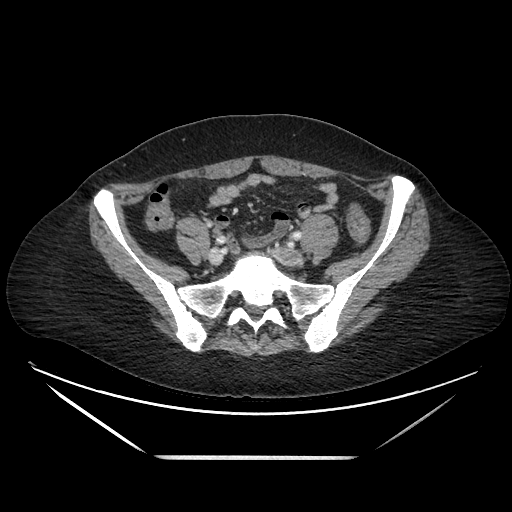
[im 110/200  soft-tissue]
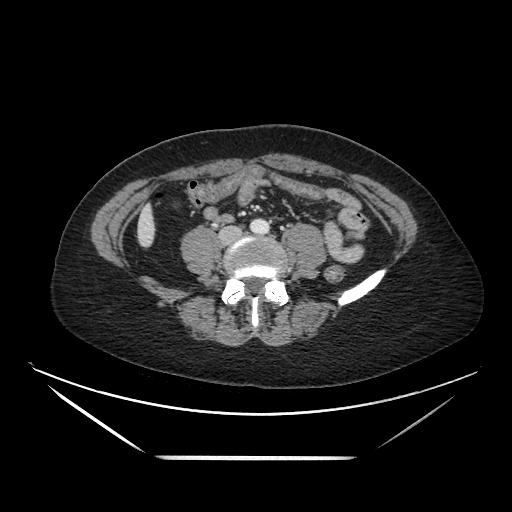
[im 122/200  soft-tissue]
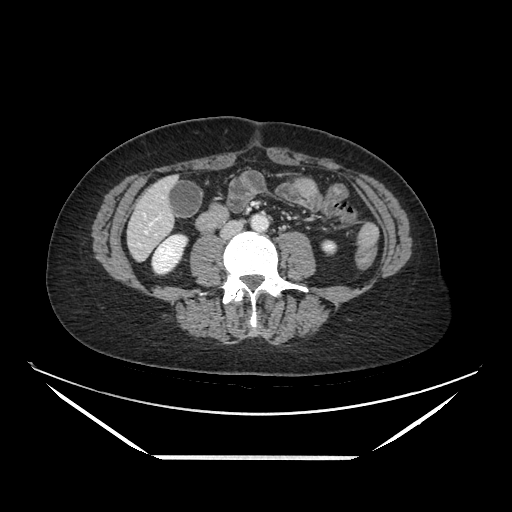
[im 122/200  bone]
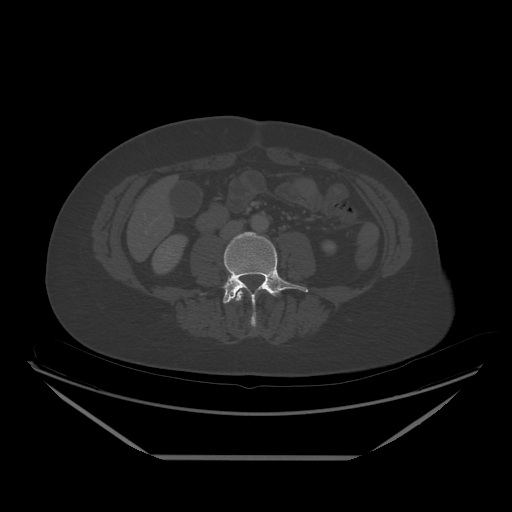
[im 135/200  soft-tissue]
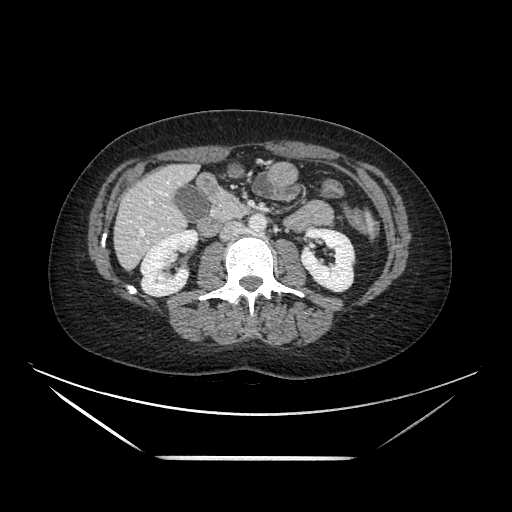
[im 148/200  soft-tissue]
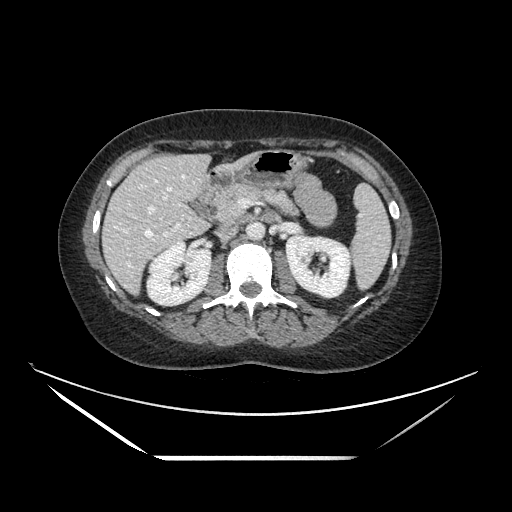
[im 161/200  soft-tissue]
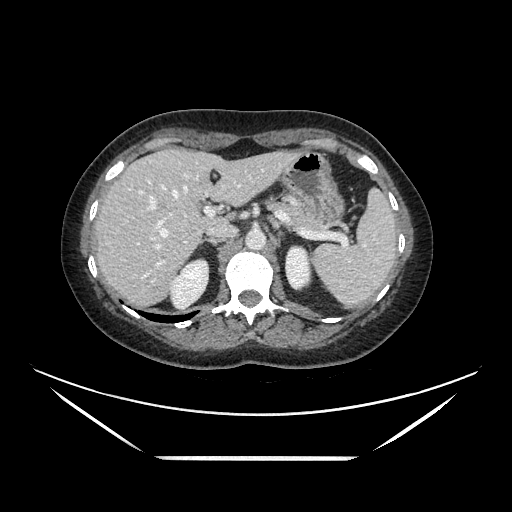
[im 174/200  soft-tissue]
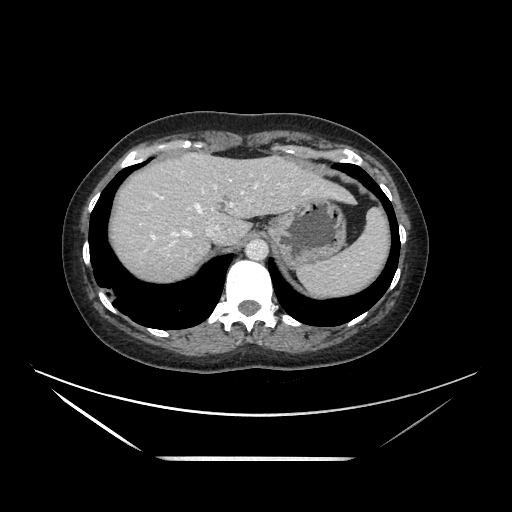
[im 187/200  soft-tissue]
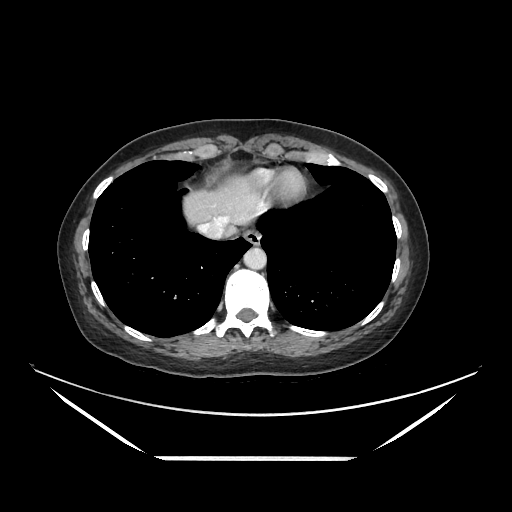

[Series 602: sag standard 2x2 · sagittal · 0.98mm/px · 3 of 224 slices shown]
[im 75/224  soft-tissue]
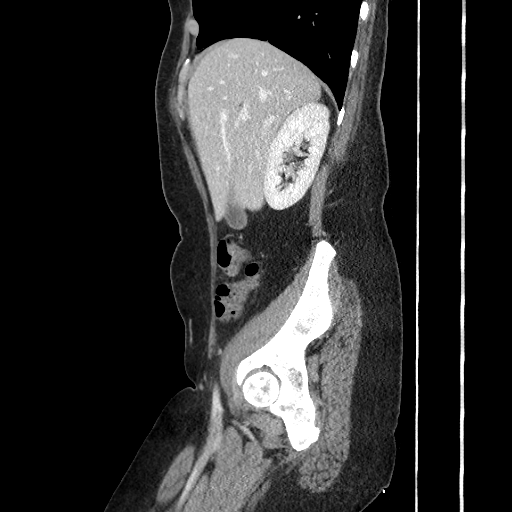
[im 100/224  soft-tissue]
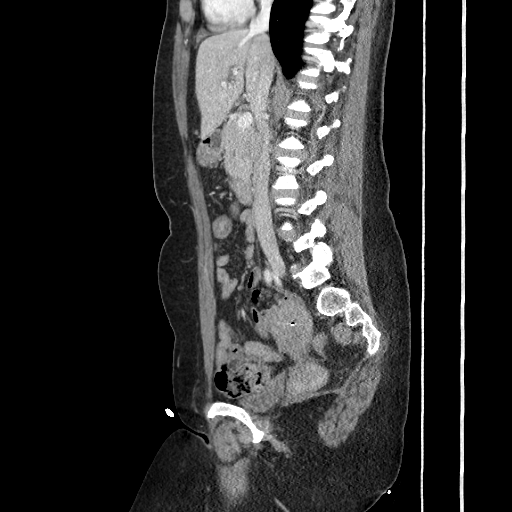
[im 124/224  soft-tissue]
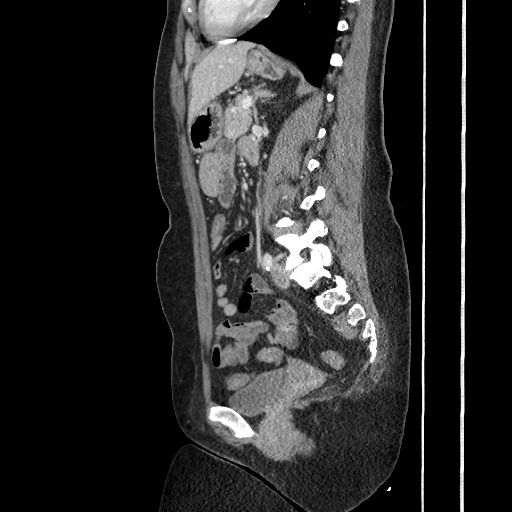

[17 of 46 positions shown; findings below may reference images not displayed]

FINDINGS: Lung Bases: Small patches of peripheral opacity in the right lung base.
Heart: Unremarkable.
Distal Esophagus and Stomach: Unremarkable.
Small and Large Bowel: No small bowel obstruction. No evidence of acute appendicitis. Diffuse thickening of the colon and rectum.
Liver/Biliary: Normal appearance of the gallbladder. No biliary dilation. Hepatic steatosis.
Spleen: Splenic cysts measuring 2.4 cm in the mid spleen and 7 mm in the upper pole.
Pancreas: Unremarkable.
Adrenal Glands: Unremarkable.
Kidneys: Unremarkable.
Bladder: Unremarkable.
Reproductive: IUD in the endometrial cavity. No suspicious adnexal masses.
Vascular: Normal caliber of the aorta. The portal and splenic veins are patent.
Lymph Nodes: No suspicious lymphadenopathy.
Soft Tissues: Unremarkable.
Bones: No suspicious lytic or blastic lesions.
IMPRESSION: 
IMPRESSION: 1. Thickening of the colon and rectum suggesting proctocolitis.
2. Small foci of opacity in the peripheral aspect of the right lung base which likely represent atelectasis or scarring.
All CT scans at this facility use iterative reconstruction technique, dose modulation, and/or weight based dosing when appropriate to reduce radiation dose to as low as reasonably achievable.

## 2023-12-26 IMAGING — MR MRI ABDOMEN WITHOUT CONTRAST
7 of 9 series · 37 of 48 positions shown · non-contrast
Comparison: Ultrasound 12/25/2023

FINAL REPORT:
MRI ABDOMEN WITHOUT CONTRAST
INDICATION: Epigastric pain.
TECHNIQUE: Multiplanar multisequence MRI of the abdomen was obtained without contrast. Additional 3D/MRCP images were obtained.

[Series 1001: survey · axial · 8.0mm · 0.84mm/px · z∈[+10,+217]mm · 2 of 23 slices shown]
[im 1/23]
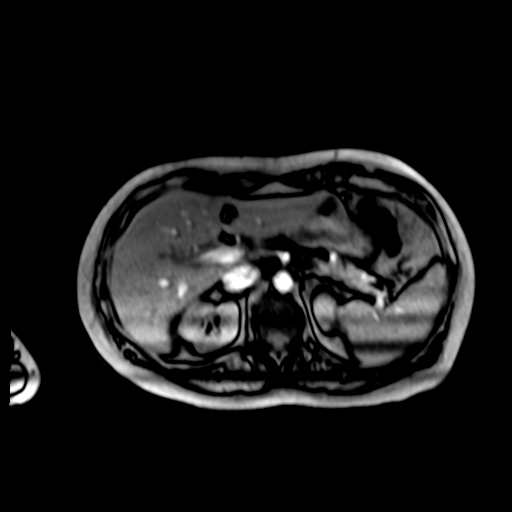
[im 23/23]
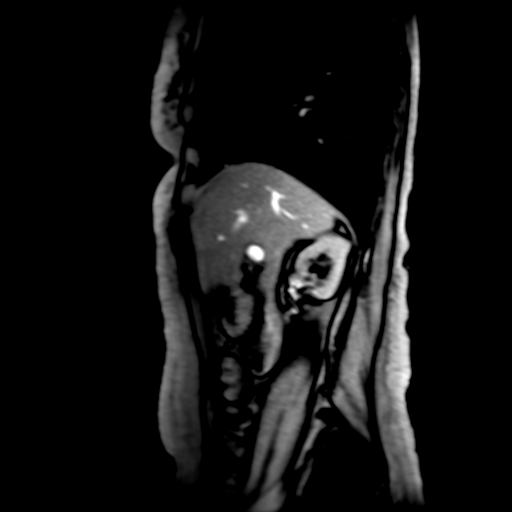

[Series 2001: T2 · coronal · 7.0mm · 1.37mm/px · 3 of 30 slices shown (1 of 2)]
[im 1/30]
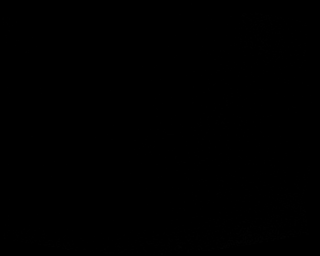
[im 15/30]
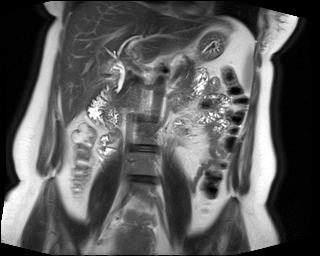
[im 30/30]
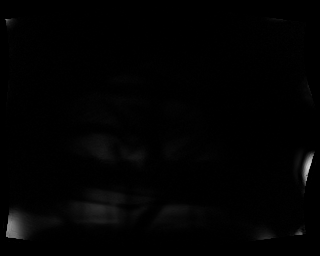

[Series 3001: T2 · axial · 7.0mm · 1.19mm/px · z∈[-175,+111]mm · 3 of 35 slices shown (2 of 2)]
[im 1/35]
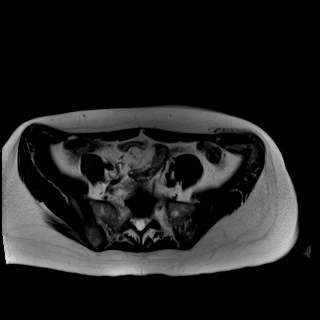
[im 18/35]
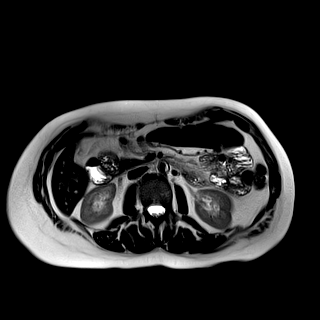
[im 35/35]
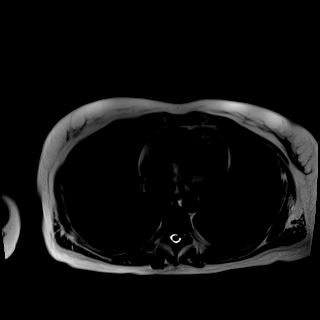

[Series 4001: T1 dynamic · axial · 3.0mm · 1.19mm/px · z∈[-129,+108]mm · 7 of 80 slices shown (1 of 3)]
[im 1/80]
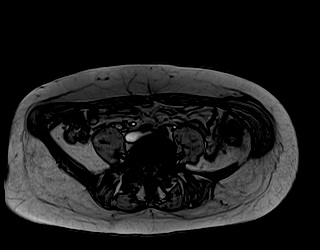
[im 14/80]
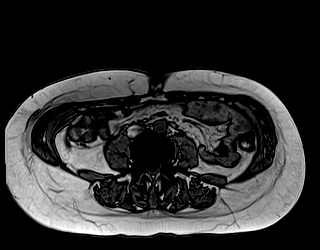
[im 27/80]
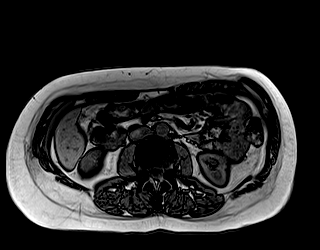
[im 40/80]
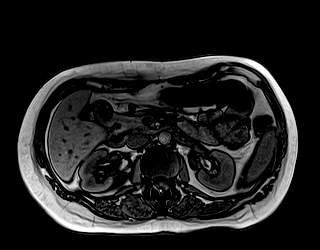
[im 53/80]
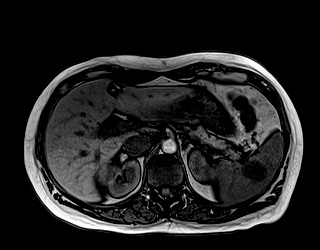
[im 66/80]
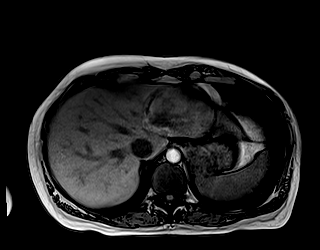
[im 80/80]
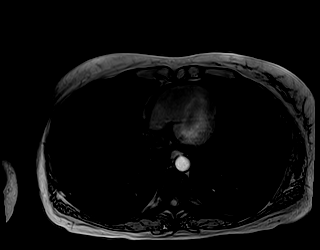

[Series 5001: T1 dynamic · axial · 3.0mm · 1.19mm/px · z∈[-129,+108]mm · 7 of 80 slices shown (2 of 3)]
[im 1/80]
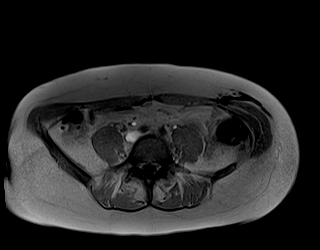
[im 14/80]
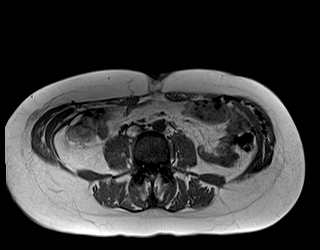
[im 27/80]
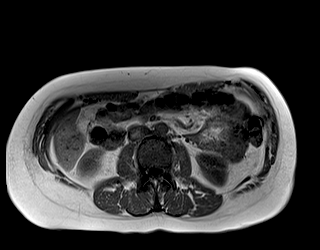
[im 40/80]
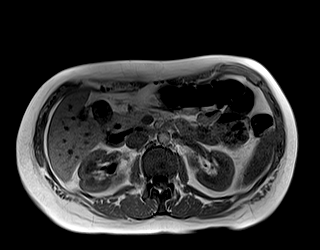
[im 53/80]
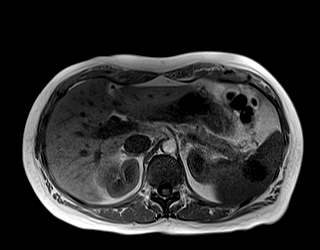
[im 66/80]
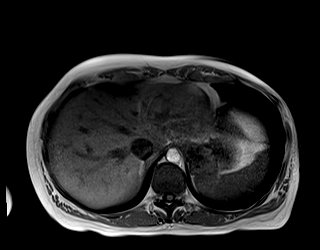
[im 80/80]
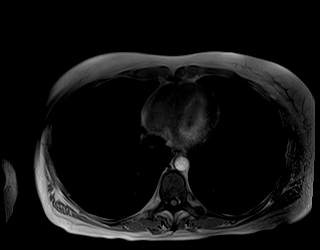

[Series 6001: T1 dynamic · axial · 3.0mm · 1.19mm/px · z∈[-129,+108]mm · 7 of 80 slices shown (3 of 3)]
[im 1/80]
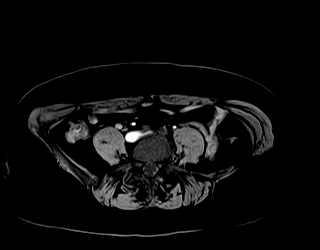
[im 14/80]
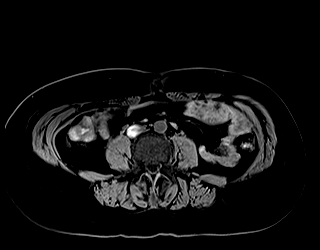
[im 27/80]
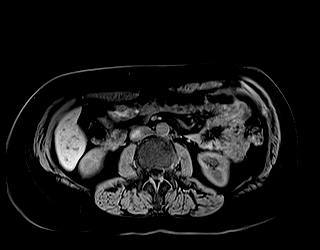
[im 40/80]
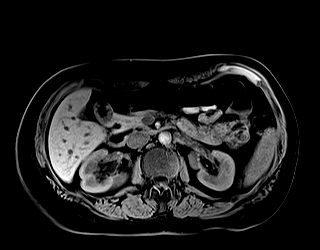
[im 53/80]
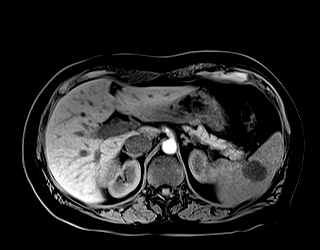
[im 66/80]
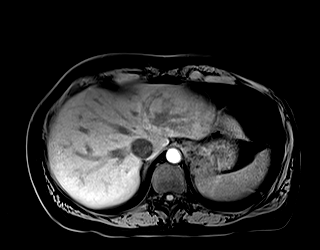
[im 80/80]
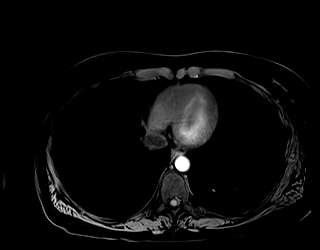

[Series 9001: ax true fisp · axial · 4.0mm · 0.74mm/px · z∈[-133,+133]mm · 8 of 90 slices shown]
[im 1/90]
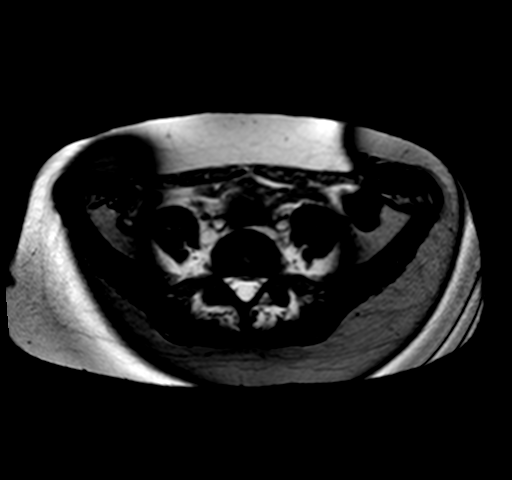
[im 13/90]
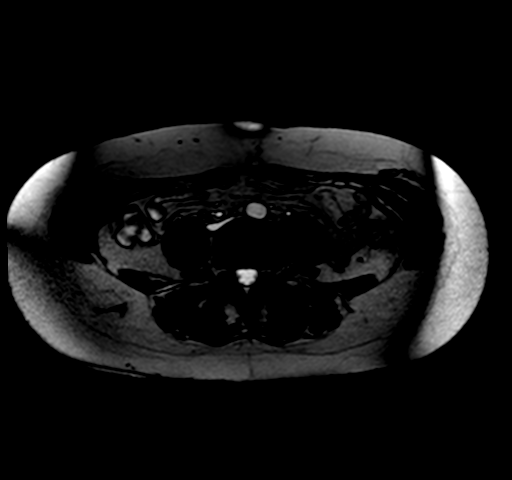
[im 26/90]
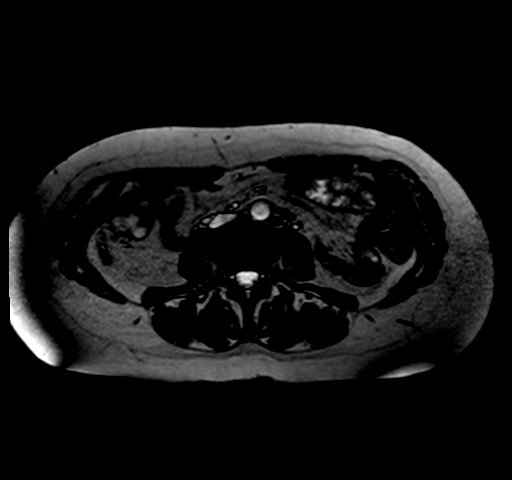
[im 39/90]
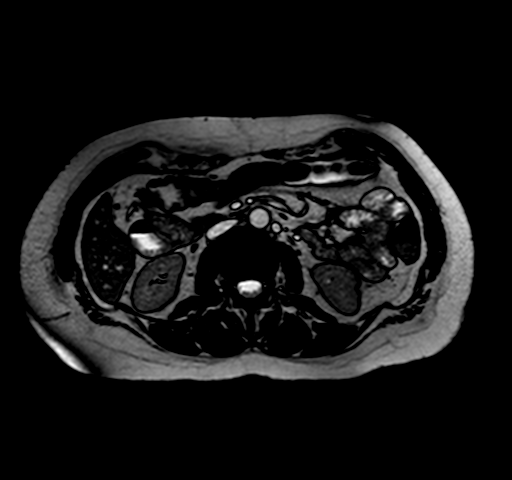
[im 51/90]
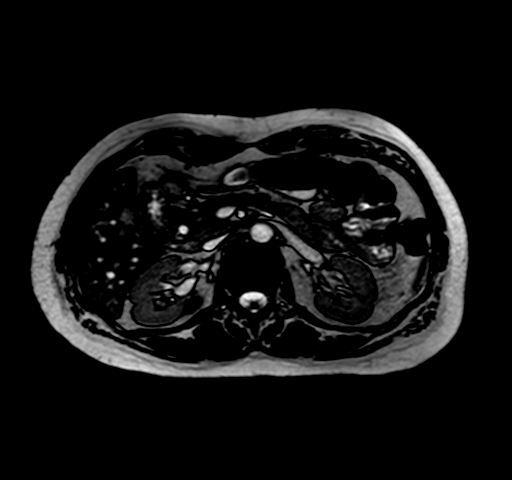
[im 64/90]
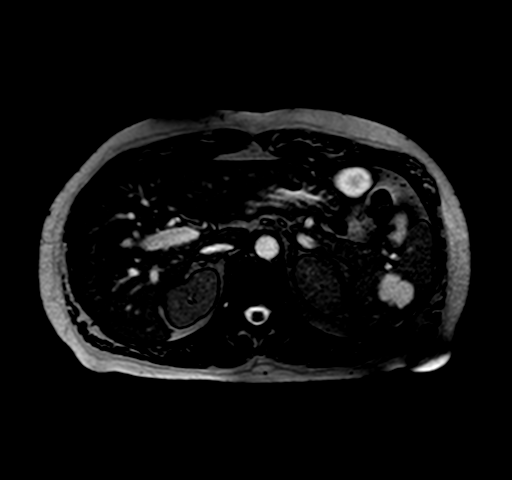
[im 77/90]
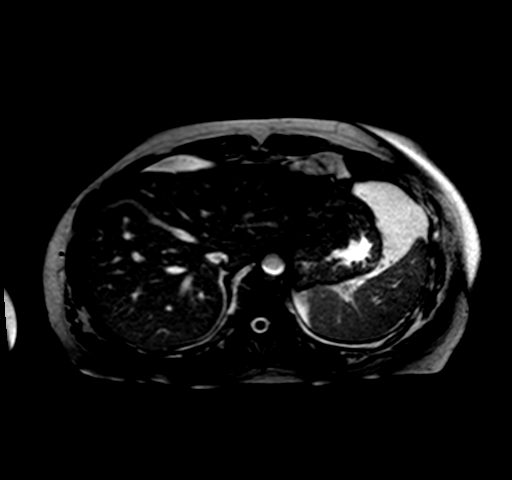
[im 90/90]
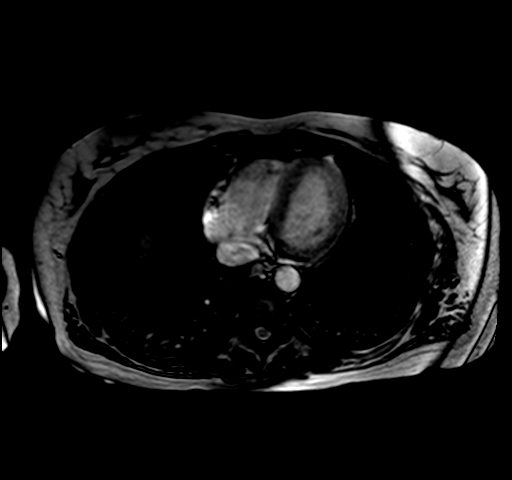

[37 of 48 positions shown; findings below may reference images not displayed]

FINDINGS: Lower thorax: No gross abnormality. Not well evaluated by MRI.
Liver: Normal.
Gallbladder/biliary: Cholecystectomy. No intrahepatic or extrahepatic biliary ductal dilatation. On the axial and coronal T2 weighted images, there appears to be a small T2 hypointense filling defect within the distal duct measuring up to 4 mm (0334/20 and 2229/14).
Spleen: T1 hypointense, T2 hyperintense lobulated fluid signal focus within the mid spleen measures up to 2.9 cm.
Pancreas: Normal.
Adrenal glands: Normal.
Kidneys: Normal.
Abdominal wall: Normal.
Vascular: Normal.
Mesentery: No adenopathy, free air, or free fluid.
Stomach/bowel: No bowel dilatation or wall thickening. Moderate stool throughout the colon.
Bones: No acute osseous abnormality.
IMPRESSION: 1.  4 mm T2 hypointense filling defect within the distal common duct suspicious for choledocholithiasis. No intrahepatic or extrahepatic biliary ductal dilatation.
2.  2.9 cm T2 hyperintense focus within the spleen. This is nonspecific but may represent a splenic pseudocyst or hemangioma.
Is the patient pregnant?
No

## 8386-10-23 DEATH — deceased
